# Patient Record
Sex: Female | Born: 1965 | Race: White | Hispanic: No | Marital: Married | Smoking: Never smoker
Health system: Southern US, Community
[De-identification: ages and names within clinical notes are randomized; demographics above are authoritative.]

## PROBLEM LIST (undated history)

## (undated) DIAGNOSIS — F341 Dysthymic disorder: Secondary | ICD-10-CM

## (undated) DIAGNOSIS — IMO0002 Reserved for concepts with insufficient information to code with codable children: Secondary | ICD-10-CM

## (undated) DIAGNOSIS — R51 Headache: Secondary | ICD-10-CM

## (undated) DIAGNOSIS — F32A Depression, unspecified: Secondary | ICD-10-CM

## (undated) DIAGNOSIS — M545 Low back pain, unspecified: Secondary | ICD-10-CM

## (undated) DIAGNOSIS — Z973 Presence of spectacles and contact lenses: Secondary | ICD-10-CM

## (undated) DIAGNOSIS — E785 Hyperlipidemia, unspecified: Secondary | ICD-10-CM

## (undated) DIAGNOSIS — E559 Vitamin D deficiency, unspecified: Secondary | ICD-10-CM

## (undated) DIAGNOSIS — M1990 Unspecified osteoarthritis, unspecified site: Secondary | ICD-10-CM

## (undated) DIAGNOSIS — G8929 Other chronic pain: Secondary | ICD-10-CM

## (undated) DIAGNOSIS — R7301 Impaired fasting glucose: Secondary | ICD-10-CM

## (undated) DIAGNOSIS — F329 Major depressive disorder, single episode, unspecified: Secondary | ICD-10-CM

## (undated) DIAGNOSIS — R519 Headache, unspecified: Secondary | ICD-10-CM

## (undated) DIAGNOSIS — F419 Anxiety disorder, unspecified: Secondary | ICD-10-CM

## (undated) DIAGNOSIS — S139XXA Sprain of joints and ligaments of unspecified parts of neck, initial encounter: Secondary | ICD-10-CM

## (undated) DIAGNOSIS — S161XXA Strain of muscle, fascia and tendon at neck level, initial encounter: Secondary | ICD-10-CM

## (undated) HISTORY — PX: SKIN CANCER EXCISION: SHX779

## (undated) HISTORY — DX: Dysthymic disorder: F34.1

## (undated) HISTORY — DX: Vitamin D deficiency, unspecified: E55.9

## (undated) HISTORY — DX: Sprain of joints and ligaments of unspecified parts of neck, initial encounter: S13.9XXA

## (undated) HISTORY — DX: Low back pain: M54.5

## (undated) HISTORY — DX: Headache, unspecified: R51.9

## (undated) HISTORY — DX: Impaired fasting glucose: R73.01

## (undated) HISTORY — DX: Other chronic pain: G89.29

## (undated) HISTORY — DX: Depression, unspecified: F32.A

## (undated) HISTORY — DX: Headache: R51

## (undated) HISTORY — DX: Reserved for concepts with insufficient information to code with codable children: IMO0002

## (undated) HISTORY — DX: Anxiety disorder, unspecified: F41.9

## (undated) HISTORY — DX: Major depressive disorder, single episode, unspecified: F32.9

## (undated) HISTORY — PX: TONSILLECTOMY: SUR1361

## (undated) HISTORY — DX: Hyperlipidemia, unspecified: E78.5

## (undated) HISTORY — DX: Low back pain, unspecified: M54.50

## (undated) HISTORY — PX: HERNIA REPAIR: SHX51

## (undated) HISTORY — DX: Strain of muscle, fascia and tendon at neck level, initial encounter: S16.1XXA

---

## 2004-05-06 ENCOUNTER — Ambulatory Visit (HOSPITAL_COMMUNITY): Admission: RE | Admit: 2004-05-06 | Discharge: 2004-05-06 | Payer: Self-pay | Admitting: Obstetrics and Gynecology

## 2004-11-27 ENCOUNTER — Inpatient Hospital Stay (HOSPITAL_COMMUNITY): Admission: AD | Admit: 2004-11-27 | Discharge: 2004-11-27 | Payer: Self-pay | Admitting: *Deleted

## 2005-04-28 ENCOUNTER — Ambulatory Visit: Payer: Self-pay | Admitting: Obstetrics and Gynecology

## 2006-05-23 ENCOUNTER — Ambulatory Visit: Payer: Self-pay | Admitting: Obstetrics and Gynecology

## 2007-06-14 ENCOUNTER — Ambulatory Visit: Payer: Self-pay | Admitting: Obstetrics and Gynecology

## 2007-06-20 ENCOUNTER — Ambulatory Visit: Payer: Self-pay | Admitting: Obstetrics and Gynecology

## 2008-07-03 ENCOUNTER — Ambulatory Visit: Payer: Self-pay | Admitting: Obstetrics and Gynecology

## 2009-07-09 ENCOUNTER — Ambulatory Visit: Payer: Self-pay | Admitting: Obstetrics and Gynecology

## 2010-07-13 ENCOUNTER — Ambulatory Visit: Payer: Self-pay | Admitting: Obstetrics and Gynecology

## 2010-07-27 ENCOUNTER — Ambulatory Visit: Payer: Self-pay | Admitting: Obstetrics and Gynecology

## 2011-08-11 ENCOUNTER — Ambulatory Visit: Payer: Self-pay | Admitting: Obstetrics and Gynecology

## 2012-08-16 ENCOUNTER — Ambulatory Visit: Payer: Self-pay | Admitting: Obstetrics and Gynecology

## 2012-10-29 ENCOUNTER — Ambulatory Visit: Payer: Self-pay

## 2015-03-31 ENCOUNTER — Other Ambulatory Visit: Payer: Self-pay | Admitting: Unknown Physician Specialty

## 2015-03-31 MED ORDER — SERTRALINE HCL 50 MG PO TABS: 50.0000 mg | ORAL_TABLET | Freq: Every day | ORAL | Status: DC

## 2015-07-01 ENCOUNTER — Other Ambulatory Visit: Payer: Self-pay | Admitting: Unknown Physician Specialty

## 2015-07-10 ENCOUNTER — Telehealth: Payer: Self-pay | Admitting: Unknown Physician Specialty

## 2015-07-10 ENCOUNTER — Encounter: Payer: Self-pay | Admitting: Unknown Physician Specialty

## 2015-07-10 ENCOUNTER — Ambulatory Visit (INDEPENDENT_AMBULATORY_CARE_PROVIDER_SITE_OTHER): Payer: Managed Care, Other (non HMO) | Admitting: Unknown Physician Specialty

## 2015-07-10 VITALS — BP 107/73 | HR 71 | Temp 98.5°F | Ht 69.6 in | Wt 167.0 lb

## 2015-07-10 DIAGNOSIS — G4700 Insomnia, unspecified: Secondary | ICD-10-CM

## 2015-07-10 DIAGNOSIS — S161XXA Strain of muscle, fascia and tendon at neck level, initial encounter: Secondary | ICD-10-CM | POA: Diagnosis not present

## 2015-07-10 DIAGNOSIS — S46011A Strain of muscle(s) and tendon(s) of the rotator cuff of right shoulder, initial encounter: Secondary | ICD-10-CM

## 2015-07-10 MED ORDER — CYCLOBENZAPRINE HCL 10 MG PO TABS: 10.0000 mg | ORAL_TABLET | Freq: Three times a day (TID) | ORAL | Status: DC | PRN

## 2015-07-10 MED ORDER — MELOXICAM 15 MG PO TABS: 15.0000 mg | ORAL_TABLET | Freq: Every day | ORAL | Status: DC

## 2015-07-10 MED ORDER — CLONAZEPAM 0.5 MG PO TABS: 0.5000 mg | ORAL_TABLET | Freq: Every day | ORAL | Status: DC

## 2015-07-10 NOTE — Assessment & Plan Note (Signed)
Refill Clonazepam but cautioned not to take it with Flexeril.  Begin weaning to occasional use.

## 2015-07-10 NOTE — Telephone Encounter (Signed)
Pt added to CW's schedule stated she talked with someone on Monday and was supposed to be added to the schedule today for 9:15, pt was not on the the schedule.

## 2015-07-10 NOTE — Progress Notes (Signed)
BP 107/73 mmHg  Pulse 71  Temp(Src) 98.5 F (36.9 C)  Ht 5' 9.6" (1.768 m)  Wt 167 lb (75.751 kg)  BMI 24.23 kg/m2  SpO2 97%   Subjective:    Patient ID: Virginia Austin, female    DOB: 01/31/1966, 49 y.o.   MRN: [id number]  HPI: Virginia Austin is a 49 y.o. female  Chief Complaint  Patient presents with  . Medication Refill  . Shoulder Pain    right, includes some neck pain   Neck/Shoulder Pain The pt presents to the office with c/o right neck and shoulder pain onset 2 months ago with worsening symptoms this morning.  The quality of pain is a shooting pain worse when raising her arm.  The pain is constant, he denies trauma she initially thought she slept wrong.  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  She occasionally applies heat and takes otc Ibuprofen without relief of symptoms.  Pertinent negatives denies pain, shortness of breath, palpitations, numbness/tingling or edema  Insomnia Pt taking Clonazepam QHS.  She would like a refill.  She understands not to take Flexeril plus Clonazepam.  She is taking Sertraline daily.    Relevant past medical, surgical, family and social history reviewed and updated as indicated. Interim medical history since our last visit reviewed. Allergies and medications reviewed and updated.  Review of Systems  Constitutional: Negative.   HENT: Negative.   Eyes: Negative.   Respiratory: Negative.   Cardiovascular: Negative.   Gastrointestinal: Negative.   Musculoskeletal: Positive for neck pain and neck stiffness.       Shoulder pain  Neurological: Negative.   Psychiatric/Behavioral: Negative.     Per HPI unless specifically indicated above     Objective:    BP 107/73 mmHg  Pulse 71  Temp(Src) 98.5 F (36.9 C)  Ht 5' 9.6" (1.768 m)  Wt 167 lb (75.751 kg)  BMI 24.23 kg/m2  SpO2 97%  Wt Readings from Last 3 Encounters:  07/08/14 163 lb (73.936 kg)  07/10/15 167 lb (75.751 kg)    Physical Exam   Constitutional: She is oriented to person, place, and time. She appears well-developed and well-nourished. No distress.  HENT:  Head: Normocephalic and atraumatic.  Right Ear: External ear normal.  Left Ear: External ear normal.  Nose: Nose normal.  Neck:  Limited ROM of neck secondary to pain  Cardiovascular: Normal rate, regular rhythm, normal heart sounds and intact distal pulses.   Pulmonary/Chest: Effort normal and breath sounds normal. No respiratory distress. She has no wheezes. She exhibits no tenderness.  Musculoskeletal: She exhibits no edema.  Limited ROM of neck and right shoulder secondary to tenderness; tenderness present with Empty Can and Neer's exam  Neurological: She is alert and oriented to person, place, and time.  Skin: Skin is warm and dry. No rash noted. She is not diaphoretic. No erythema. No pallor.  Psychiatric: She has a normal mood and affect. Her behavior is normal. Judgment and thought content normal.    No results found for this or any previous visit.    Assessment & Plan:   Problem List Items Addressed This Visit      Unprioritized   Insomnia    Refill Clonazepam but cautioned not to take it with Flexeril.  Begin weaning to occasional use.         Other Visit Diagnoses    Cervical strain, initial encounter    -  Primary  Referral made to Physical Therapy Prescribed Meloxicam and Flexiral for muscle spasms and pain    Relevant Orders    Ambulatory referral to Physical Therapy    Rotator cuff (capsule) sprain and strain, right, initial encounter        Referral made to Physical Therapy Prescribed Meloxicam and Flexiral for muscle spasms and pain    Relevant Orders    Ambulatory referral to Physical Therapy        Follow up plan: Return if symptoms worsen or fail to improve.

## 2015-11-16 ENCOUNTER — Other Ambulatory Visit: Payer: Self-pay | Admitting: Obstetrics and Gynecology

## 2015-11-18 ENCOUNTER — Encounter (HOSPITAL_COMMUNITY)
Admission: RE | Admit: 2015-11-18 | Discharge: 2015-11-18 | Disposition: A | Discharge status: Home / Self Care | Payer: Managed Care, Other (non HMO) | Source: Ambulatory Visit | Attending: Obstetrics and Gynecology | Admitting: Obstetrics and Gynecology

## 2015-11-18 ENCOUNTER — Encounter (HOSPITAL_COMMUNITY): Payer: Self-pay

## 2015-11-18 DIAGNOSIS — N842 Polyp of vagina: Secondary | ICD-10-CM | POA: Diagnosis not present

## 2015-11-18 DIAGNOSIS — E785 Hyperlipidemia, unspecified: Secondary | ICD-10-CM | POA: Diagnosis not present

## 2015-11-18 DIAGNOSIS — N950 Postmenopausal bleeding: Secondary | ICD-10-CM | POA: Diagnosis not present

## 2015-11-18 DIAGNOSIS — N840 Polyp of corpus uteri: Secondary | ICD-10-CM | POA: Diagnosis not present

## 2015-11-18 LAB — CBC
HCT: 35.9 % — ABNORMAL LOW (ref 36.0–46.0)
Hemoglobin: 12.1 g/dL (ref 12.0–15.0)
MCH: 30.8 pg (ref 26.0–34.0)
MCHC: 33.7 g/dL (ref 30.0–36.0)
MCV: 91.3 fL (ref 78.0–100.0)
PLATELETS: 330 10*3/uL (ref 150–400)
RBC: 3.93 MIL/uL (ref 3.87–5.11)
RDW: 13.1 % (ref 11.5–15.5)
WBC: 5.8 10*3/uL (ref 4.0–10.5)

## 2015-11-18 NOTE — Patient Instructions (Addendum)
Your procedure is scheduled on: Thursday, March 9  Enter through the Main Entrance of [facility] at: 7:30am  Pick up the phone at the desk and dial [phone].  Call this number if you have problems the morning of surgery: [phone].  Remember: Do NOT eat food: after midnight Wednesday, 3/8 Do NOT drink clear liquids  after midnight Wednesday, 3/8 Take these medicines the morning of surgery with a SIP OF WATER:  None  Do NOT wear jewelry (body piercing), metal hair clips/bobby pins, make-up, or nail polish. Do NOT wear lotions, powders, or perfumes.  You may wear deoderant. Do NOT shave for 48 hours prior to surgery. Do NOT bring valuables to the hospital. Contacts, dentures, or bridgework may not be worn into surgery.   Have a responsible adult drive you home and stay with you for 24 hours after your procedure.

## 2015-11-19 ENCOUNTER — Encounter (HOSPITAL_COMMUNITY)
Admission: RE | Disposition: A | Discharge status: Home / Self Care | Payer: Self-pay | Source: Ambulatory Visit | Attending: Obstetrics and Gynecology

## 2015-11-19 ENCOUNTER — Ambulatory Visit (HOSPITAL_COMMUNITY): Payer: Managed Care, Other (non HMO) | Admitting: Anesthesiology

## 2015-11-19 ENCOUNTER — Ambulatory Visit (HOSPITAL_COMMUNITY)
Admission: RE | Admit: 2015-11-19 | Discharge: 2015-11-19 | Disposition: A | Discharge status: Home / Self Care | Payer: Managed Care, Other (non HMO) | Source: Ambulatory Visit | Attending: Obstetrics and Gynecology | Admitting: Obstetrics and Gynecology

## 2015-11-19 ENCOUNTER — Encounter (HOSPITAL_COMMUNITY): Payer: Self-pay | Admitting: *Deleted

## 2015-11-19 DIAGNOSIS — E785 Hyperlipidemia, unspecified: Secondary | ICD-10-CM | POA: Insufficient documentation

## 2015-11-19 DIAGNOSIS — N842 Polyp of vagina: Secondary | ICD-10-CM | POA: Insufficient documentation

## 2015-11-19 DIAGNOSIS — N840 Polyp of corpus uteri: Secondary | ICD-10-CM | POA: Insufficient documentation

## 2015-11-19 DIAGNOSIS — N950 Postmenopausal bleeding: Secondary | ICD-10-CM | POA: Insufficient documentation

## 2015-11-19 HISTORY — PX: DILATATION & CURETTAGE/HYSTEROSCOPY WITH MYOSURE: SHX6511

## 2015-11-19 SURGERY — DILATATION & CURETTAGE/HYSTEROSCOPY WITH MYOSURE
Anesthesia: General

## 2015-11-19 MED ORDER — FENTANYL CITRATE (PF) 100 MCG/2ML IJ SOLN
25.0000 ug | INTRAMUSCULAR | Status: DC | PRN
  Administered 2015-11-19: 50 ug via INTRAVENOUS
  Administered 2015-11-19 (×2): 25 ug via INTRAVENOUS
  Administered 2015-11-19: 50 ug via INTRAVENOUS

## 2015-11-19 MED ORDER — MIDAZOLAM HCL 2 MG/2ML IJ SOLN
INTRAMUSCULAR | Status: AC
  Filled 2015-11-19: qty 2

## 2015-11-19 MED ORDER — SCOPOLAMINE 1 MG/3DAYS TD PT72
1.0000 | MEDICATED_PATCH | Freq: Once | TRANSDERMAL | Status: DC
  Administered 2015-11-19: 1.5 mg via TRANSDERMAL

## 2015-11-19 MED ORDER — ONDANSETRON HCL 4 MG/2ML IJ SOLN
INTRAMUSCULAR | Status: DC | PRN
  Administered 2015-11-19: 4 mg via INTRAVENOUS

## 2015-11-19 MED ORDER — DEXAMETHASONE SODIUM PHOSPHATE 4 MG/ML IJ SOLN
INTRAMUSCULAR | Status: AC
  Filled 2015-11-19: qty 1

## 2015-11-19 MED ORDER — FENTANYL CITRATE (PF) 100 MCG/2ML IJ SOLN
INTRAMUSCULAR | Status: AC
  Filled 2015-11-19: qty 2

## 2015-11-19 MED ORDER — DEXAMETHASONE SODIUM PHOSPHATE 10 MG/ML IJ SOLN
INTRAMUSCULAR | Status: DC | PRN
  Administered 2015-11-19: 4 mg via INTRAVENOUS

## 2015-11-19 MED ORDER — SODIUM CHLORIDE 0.9 % IJ SOLN
INTRAMUSCULAR | Status: AC
  Filled 2015-11-19: qty 50

## 2015-11-19 MED ORDER — VASOPRESSIN 20 UNIT/ML IV SOLN
INTRAVENOUS | Status: DC | PRN
Start: 2015-11-19 — End: 2015-11-19
  Administered 2015-11-19: 20 [IU] via INTRAMUSCULAR

## 2015-11-19 MED ORDER — BUPIVACAINE HCL (PF) 0.25 % IJ SOLN
INTRAMUSCULAR | Status: AC
  Filled 2015-11-19: qty 30

## 2015-11-19 MED ORDER — KETOROLAC TROMETHAMINE 30 MG/ML IJ SOLN
INTRAMUSCULAR | Status: AC
  Filled 2015-11-19: qty 1

## 2015-11-19 MED ORDER — FENTANYL CITRATE (PF) 100 MCG/2ML IJ SOLN
INTRAMUSCULAR | Status: AC
  Administered 2015-11-19: 50 ug via INTRAVENOUS
  Filled 2015-11-19: qty 2

## 2015-11-19 MED ORDER — SODIUM CHLORIDE 0.9 % IR SOLN
Status: DC | PRN
  Administered 2015-11-19: 3000 mL

## 2015-11-19 MED ORDER — SCOPOLAMINE 1 MG/3DAYS TD PT72
MEDICATED_PATCH | TRANSDERMAL | Status: AC
  Administered 2015-11-19: 1.5 mg via TRANSDERMAL
  Filled 2015-11-19: qty 1

## 2015-11-19 MED ORDER — SODIUM CHLORIDE 0.9 % IJ SOLN
INTRAMUSCULAR | Status: DC | PRN
  Administered 2015-11-19: 50 mL

## 2015-11-19 MED ORDER — CEFAZOLIN SODIUM-DEXTROSE 2-3 GM-% IV SOLR
INTRAVENOUS | Status: AC
  Filled 2015-11-19: qty 50

## 2015-11-19 MED ORDER — ONDANSETRON HCL 4 MG/2ML IJ SOLN
INTRAMUSCULAR | Status: AC
  Filled 2015-11-19: qty 2

## 2015-11-19 MED ORDER — VASOPRESSIN 20 UNIT/ML IV SOLN
INTRAVENOUS | Status: AC
  Filled 2015-11-19: qty 1

## 2015-11-19 MED ORDER — BUPIVACAINE HCL (PF) 0.25 % IJ SOLN
INTRAMUSCULAR | Status: DC | PRN
  Administered 2015-11-19: 11 mL

## 2015-11-19 MED ORDER — KETOROLAC TROMETHAMINE 30 MG/ML IJ SOLN
INTRAMUSCULAR | Status: DC | PRN
  Administered 2015-11-19: 30 mg via INTRAVENOUS

## 2015-11-19 MED ORDER — CEFAZOLIN SODIUM-DEXTROSE 2-3 GM-% IV SOLR
2.0000 g | INTRAVENOUS | Status: AC
  Administered 2015-11-19: 2 g via INTRAVENOUS

## 2015-11-19 MED ORDER — PROPOFOL 10 MG/ML IV BOLUS
INTRAVENOUS | Status: AC
  Filled 2015-11-19: qty 20

## 2015-11-19 MED ORDER — LACTATED RINGERS IV SOLN
INTRAVENOUS | Status: DC
  Administered 2015-11-19: 08:00:00 via INTRAVENOUS

## 2015-11-19 MED ORDER — FENTANYL CITRATE (PF) 100 MCG/2ML IJ SOLN
INTRAMUSCULAR | Status: DC | PRN
  Administered 2015-11-19 (×2): 50 ug via INTRAVENOUS

## 2015-11-19 MED ORDER — MIDAZOLAM HCL 2 MG/2ML IJ SOLN
INTRAMUSCULAR | Status: DC | PRN
  Administered 2015-11-19: 1 mg via INTRAVENOUS

## 2015-11-19 MED ORDER — LIDOCAINE HCL (CARDIAC) 20 MG/ML IV SOLN
INTRAVENOUS | Status: AC
  Filled 2015-11-19: qty 5

## 2015-11-19 MED ORDER — PROPOFOL 10 MG/ML IV BOLUS
INTRAVENOUS | Status: DC | PRN
  Administered 2015-11-19: 200 mg via INTRAVENOUS

## 2015-11-19 MED ORDER — ONDANSETRON HCL 4 MG/2ML IJ SOLN: 4.0000 mg | Freq: Once | INTRAMUSCULAR | Status: DC | PRN

## 2015-11-19 MED ORDER — LIDOCAINE HCL (CARDIAC) 20 MG/ML IV SOLN
INTRAVENOUS | Status: DC | PRN
  Administered 2015-11-19: 70 mg via INTRAVENOUS
  Administered 2015-11-19: 30 mg via INTRAVENOUS

## 2015-11-19 MED ORDER — HYDROCODONE-IBUPROFEN 7.5-200 MG PO TABS: 1.0000 | ORAL_TABLET | Freq: Three times a day (TID) | ORAL | Status: DC | PRN

## 2015-11-19 SURGICAL SUPPLY — 21 items
CANISTER SUCT 3000ML (MISCELLANEOUS) ×3 IMPLANT
CATH ROBINSON RED A/P 16FR (CATHETERS) ×3 IMPLANT
CLOTH BEACON ORANGE TIMEOUT ST (SAFETY) ×3 IMPLANT
CONTAINER PREFILL 10% NBF 60ML (FORM) ×6 IMPLANT
DEVICE MYOSURE LITE (MISCELLANEOUS) ×2 IMPLANT
DEVICE MYOSURE REACH (MISCELLANEOUS) IMPLANT
FILTER ARTHROSCOPY CONVERTOR (FILTER) ×3 IMPLANT
GLOVE BIO SURGEON STRL SZ7.5 (GLOVE) ×3 IMPLANT
GLOVE BIOGEL PI IND STRL 7.0 (GLOVE) ×1 IMPLANT
GLOVE BIOGEL PI INDICATOR 7.0 (GLOVE) ×2
GOWN STRL REUS W/TWL LRG LVL3 (GOWN DISPOSABLE) ×9 IMPLANT
PACK VAGINAL MINOR WOMEN LF (CUSTOM PROCEDURE TRAY) ×3 IMPLANT
PAD OB MATERNITY 4.3X12.25 (PERSONAL CARE ITEMS) ×3 IMPLANT
SEAL ROD LENS SCOPE MYOSURE (ABLATOR) ×3 IMPLANT
SUT VIC AB 2-0 SH 27 (SUTURE) ×3
SUT VIC AB 2-0 SH 27XBRD (SUTURE) IMPLANT
SYR TB 1ML 25GX5/8 (SYRINGE) ×3 IMPLANT
TOWEL OR 17X24 6PK STRL BLUE (TOWEL DISPOSABLE) ×6 IMPLANT
TUBING AQUILEX INFLOW (TUBING) ×3 IMPLANT
TUBING AQUILEX OUTFLOW (TUBING) ×3 IMPLANT
WATER STERILE IRR 1000ML POUR (IV SOLUTION) ×3 IMPLANT

## 2015-11-19 NOTE — Transfer of Care (Signed)
Immediate Anesthesia Transfer of Care Note  Patient: Virginia Austin  Procedure(s) Performed: Procedure(s): DILATATION & CURETTAGE/HYSTEROSCOPY WITH MYOSURE (N/A)  Patient Location: PACU  Anesthesia Type:General  Level of Consciousness: awake, alert , oriented and patient cooperative  Airway & Oxygen Therapy: Patient Spontanous Breathing and Patient connected to nasal cannula oxygen  Post-op Assessment: Report given to RN and Post -op Vital signs reviewed and stable  Post vital signs: Reviewed and stable  Last Vitals:  Filed Vitals:   11/19/15 0723  BP: 109/69  Pulse: 73  Temp: 36.6 C  Resp: 18    Complications: No apparent anesthesia complications

## 2015-11-19 NOTE — Op Note (Signed)
NAME:  KINDAL, OHAGAN NO.:  [id number]  MEDICAL RECORD NO.:  [id number]  LOCATION:  [facility]                          FACILITY:  [facility]  PHYSICIAN:  Lovenia Kim, M.D.DATE OF BIRTH:  1966/07/31  DATE OF PROCEDURE: DATE OF DISCHARGE:                              OPERATIVE REPORT   PREOPERATIVE DIAGNOSIS:  Postmenopausal bleeding.  POSTOPERATIVE DIAGNOSIS:  Postmenopausal bleeding plus endometrial polyp, vaginal wall polyp, endometrial synechiae.  PROCEDURE:  Diagnostic hysteroscopy, D and C, resection of endometrial synechiae, resection of endometrial polyp, removal of vaginal wall polyp.  SURGEON:  Lovenia Kim, MD  ASSISTANT:  None.  ANESTHESIA:  General and local.  ESTIMATED BLOOD LOSS:  Less than 50 mL.  FLUID DEFICIT:  100 mL.  COMPLICATIONS:  None.  DRAINS:  None.  COUNTS:  Correct.  DISPOSITION:  Patient to recovery room in good condition.  BRIEF OPERATIVE NOTE:  After being apprised of risks of anesthesia, infection, bleeding, and surrounding organs possible need for repair, delayed versus immediate complications to include bowel and bladder injury, possible need for repair, the patient was brought to the operating room where she was administered general anesthetic without complications, prepped and draped in usual sterile fashion. Catheterized until the bladder was empty.  Exam under anesthesia revealed a bulky anteflexed uterus and no adnexal masses.  Dilute Pitressin solution placed at 3 and 9 o'clock at the cervicovaginal junction without difficulty, 20 mL of total Marcaine solution placed in a paracervical block, 0.25%, 20 mL total without difficulty.  Cervix easily dilated up to 21 Pratt dilator.  Hysteroscope placed.  MyoSure was used to resect anterior and posterior wall endometrial polyp as well as endometrial synechiae in the right upper fundal area.  At this time, cavity appears normal.  Good hemostasis noted.  D and C  performed in the usual 4-quadrant method.  Specimen collected and sent together.  At this time, vaginal wall polyp resected using sharp dissection, 2-0 Vicryl suture placed in interrupted fashion.  Good hemostasis noted.  Fluid deficit was noted. The patient tolerated the procedure well, was awakened and transferred to recovery in good condition.     Lovenia Kim, M.D.     RJT/MEDQ  D:  11/19/2015  T:  11/19/2015  Job:  [id number]

## 2015-11-19 NOTE — Anesthesia Preprocedure Evaluation (Signed)
Anesthesia Evaluation  Patient identified by MRN, date of birth, ID band Patient awake    Reviewed: Allergy & Precautions, NPO status , Patient's Chart, lab work & pertinent test results  Airway Mallampati: II  TM Distance: >3 FB Neck ROM: Full    Dental no notable dental hx. (+) Dental Advisory Given   Pulmonary neg pulmonary ROS,    Pulmonary exam normal breath sounds clear to auscultation       Cardiovascular negative cardio ROS Normal cardiovascular exam Rhythm:Regular Rate:Normal     Neuro/Psych  Headaches, PSYCHIATRIC DISORDERS Anxiety Depression    GI/Hepatic negative GI ROS, Neg liver ROS,   Endo/Other  negative endocrine ROS  Renal/GU negative Renal ROS  negative genitourinary   Musculoskeletal negative musculoskeletal ROS (+)   Abdominal   Peds negative pediatric ROS (+)  Hematology negative hematology ROS (+)   Anesthesia Other Findings   Reproductive/Obstetrics negative OB ROS                             Anesthesia Physical Anesthesia Plan  ASA: II  Anesthesia Plan: General   Post-op Pain Management:    Induction: Intravenous  Airway Management Planned: LMA  Additional Equipment:   Intra-op Plan:   Post-operative Plan: Extubation in OR  Informed Consent: I have reviewed the patients History and Physical, chart, labs and discussed the procedure including the risks, benefits and alternatives for the proposed anesthesia with the patient or authorized representative who has indicated his/her understanding and acceptance.   Dental advisory given  Plan Discussed with: CRNA  Anesthesia Plan Comments:         Anesthesia Quick Evaluation

## 2015-11-19 NOTE — H&P (Signed)
Virginia Austin is an 50 y.o. female. PMP bleeding and surgical evaluation.  Pertinent Gynecological History: Menses: post-menopausal Bleeding: post menopausal bleeding Contraception: none DES exposure: denies Blood transfusions: none Sexually transmitted diseases: no past history Previous GYN Procedures: na  Last mammogram: normal Date: 2016 Last pap: normal Date: 2017 OB History: G2, P2   Menstrual History: Menarche age: 15  No LMP recorded. Patient is postmenopausal.    Past Medical History  Diagnosis Date  . Hyperlipidemia     diet controlled, no meds  . Anxiety   . Depression   . Vitamin D deficiency     Hx - resolved per patient  . Lumbago   . Impaired fasting glucose   . Dysthymic disorder   . Neck sprain and strain     Hx  . Chronic headaches     otc med prn    Past Surgical History  Procedure Laterality Date  . Hernia repair    . Tonsillectomy      Family History  Problem Relation Age of Onset  . Diabetes Father   . Hypertension Father   . Hypertension Sister   . Diabetes Paternal Grandmother   . Hypertension Paternal Grandmother   . Thyroid disease Paternal Grandmother     Social History:  reports that she has never smoked. She has never used smokeless tobacco. She reports that she drinks about 0.6 oz of alcohol per week. She reports that she does not use illicit drugs.  Allergies: No Known Allergies  No prescriptions prior to admission    Review of Systems  Constitutional: Negative.   All other systems reviewed and are negative.   There were no vitals taken for this visit. Physical Exam  Nursing note and vitals reviewed. Constitutional: She is oriented to person, place, and time. She appears well-developed and well-nourished.  HENT:  Head: Normocephalic and atraumatic.  Neck: Normal range of motion. Neck supple.  Cardiovascular: Normal rate and regular rhythm.   Respiratory: Effort normal and breath sounds normal.  GI: Soft. Bowel  sounds are normal.  Genitourinary: Vagina normal and uterus normal.  Musculoskeletal: Normal range of motion.  Neurological: She is alert and oriented to person, place, and time. She has normal reflexes.  Skin: Skin is warm and dry.  Psychiatric: She has a normal mood and affect.    Results for orders placed or performed during the hospital encounter of 11/18/15 (from the past 24 hour(s))  CBC     Status: Abnormal   Collection Time: 11/18/15  1:30 PM  Result Value Ref Range   WBC 5.8 4.0 - 10.5 K/uL   RBC 3.93 3.87 - 5.11 MIL/uL   Hemoglobin 12.1 12.0 - 15.0 g/dL   HCT 35.9 (L) 36.0 - 46.0 %   MCV 91.3 78.0 - 100.0 fL   MCH 30.8 26.0 - 34.0 pg   MCHC 33.7 30.0 - 36.0 g/dL   RDW 13.1 11.5 - 15.5 %   Platelets 330 150 - 400 K/uL    No results found.  Assessment/Plan: PMB with possible structural lesion Diag HS, D&C, Myosure Surgical risks discussed. Consent done.  Jenascia Bumpass J 11/19/2015, 6:38 AM

## 2015-11-19 NOTE — Progress Notes (Signed)
Patient ID: Virginia Austin, female   DOB: 07/24/1966, 50 y.o.   MRN: [id number] Patient seen and examined. Consent witnessed and signed. No changes noted. Update completed.

## 2015-11-19 NOTE — Op Note (Signed)
11/19/2015  9:45 AM  PATIENT:  Virginia Austin  49 y.o. female  PRE-OPERATIVE DIAGNOSIS:  Postmenopausal Bleeding  POST-OPERATIVE DIAGNOSIS:  Postmenopausal Bleeding Vaginal wall polyp  PROCEDURE:  Procedure(s): DILATATION & CURETTAGE HYSTEROSCOPY WITH MYOSURE RESECTION OF ENDOMETRIAL POLYP AND SYNECHIAE REMOVAL OF VAGINAL WALL POLYP  SURGEON:  Surgeon(s): Brien Few, MD  ASSISTANTS: none   ANESTHESIA:   local and general  ESTIMATED BLOOD LOSS: minimal  DRAINS: none   LOCAL MEDICATIONS USED:  MARCAINE    and Amount: 20 ml  SPECIMEN:  Source of Specimen:  endometrial polyp, EMC, vaginal wall polyp  DISPOSITION OF SPECIMEN:  PATHOLOGY  COUNTS:  YES  DICTATION #[id number]  PLAN OF CARE: dc home  PATIENT DISPOSITION:  PACU - hemodynamically stable.

## 2015-11-19 NOTE — Anesthesia Procedure Notes (Signed)
Procedure Name: LMA Insertion Date/Time: 11/19/2015 9:06 AM Performed by: Tobin Chad Pre-anesthesia Checklist: Patient identified and Emergency Drugs available Patient Re-evaluated:Patient Re-evaluated prior to inductionOxygen Delivery Method: Circle system utilized and Simple face mask Preoxygenation: Pre-oxygenation with 100% oxygen Intubation Type: IV induction and Inhalational induction Ventilation: Mask ventilation without difficulty Grade View: Grade II Number of attempts: 1

## 2015-11-19 NOTE — Anesthesia Postprocedure Evaluation (Signed)
Anesthesia Post Note  Patient: Virginia Austin  Procedure(s) Performed: Procedure(s) (LRB): [place] (N/A)  Patient location during evaluation: PACU Anesthesia Type: General Level of consciousness: awake and alert Pain management: pain level controlled Vital Signs Assessment: post-procedure vital signs reviewed and stable Respiratory status: spontaneous breathing, nonlabored ventilation, respiratory function stable and patient connected to nasal cannula oxygen Cardiovascular status: blood pressure returned to baseline and stable Postop Assessment: no signs of nausea or vomiting Anesthetic complications: no    Last Vitals:  Filed Vitals:   11/19/15 1100 11/19/15 1115  BP: 109/72 107/69  Pulse: 78 81  Temp:  37.1 C  Resp: 13 13    Last Pain:  Filed Vitals:   11/19/15 1121  PainSc: 1                  Hiroko Tregre JENNETTE

## 2015-11-21 ENCOUNTER — Encounter (HOSPITAL_COMMUNITY): Payer: Self-pay | Admitting: Obstetrics and Gynecology

## 2016-02-09 ENCOUNTER — Encounter: Payer: Self-pay | Admitting: Unknown Physician Specialty

## 2016-02-09 ENCOUNTER — Ambulatory Visit (INDEPENDENT_AMBULATORY_CARE_PROVIDER_SITE_OTHER): Payer: Managed Care, Other (non HMO) | Admitting: Unknown Physician Specialty

## 2016-02-09 VITALS — BP 107/77 | HR 88 | Temp 98.2°F | Ht 69.0 in | Wt 165.8 lb

## 2016-02-09 DIAGNOSIS — J0100 Acute maxillary sinusitis, unspecified: Secondary | ICD-10-CM

## 2016-02-09 MED ORDER — AMOXICILLIN-POT CLAVULANATE 875-125 MG PO TABS: 1.0000 | ORAL_TABLET | Freq: Two times a day (BID) | ORAL | Status: DC

## 2016-02-09 MED ORDER — BENZONATATE 200 MG PO CAPS: 200.0000 mg | ORAL_CAPSULE | Freq: Two times a day (BID) | ORAL | Status: DC | PRN

## 2016-02-09 NOTE — Progress Notes (Signed)
BP 107/77 mmHg  Pulse 88  Temp(Src) 98.2 F (36.8 C)  Ht 5\' 9"  (1.753 m)  Wt 165 lb 12.8 oz (75.206 kg)  BMI 24.47 kg/m2  SpO2 97%   Subjective:    Patient ID: Virginia Austin, female    DOB: 11-29-1965, 50 y.o.   MRN: [id number]  HPI: Virginia Austin is a 50 y.o. female  Chief Complaint  Patient presents with  . URI    pt states she has congestion, cough, sore throat, sinus pressure, green phlegm, and puffy eyes. States symptoms started 3 weeks ago.   Cough This is a new problem. Episode onset: 3 weeks. The problem has been unchanged. The problem occurs constantly. The cough is productive of purulent sputum. Associated symptoms include nasal congestion, postnasal drip, rhinorrhea and a sore throat. Pertinent negatives include no fever, headaches, hemoptysis or shortness of breath. The symptoms are aggravated by lying down. Treatments tried: cough drops, Nyquil,      Relevant past medical, surgical, family and social history reviewed and updated as indicated. Interim medical history since our last visit reviewed. Allergies and medications reviewed and updated.  Review of Systems  Constitutional: Negative for fever.  HENT: Positive for postnasal drip, rhinorrhea and sore throat.   Respiratory: Positive for cough. Negative for hemoptysis and shortness of breath.   Neurological: Negative for headaches.    Per HPI unless specifically indicated above     Objective:    BP 107/77 mmHg  Pulse 88  Temp(Src) 98.2 F (36.8 C)  Ht 5\' 9"  (1.753 m)  Wt 165 lb 12.8 oz (75.206 kg)  BMI 24.47 kg/m2  SpO2 97%  Wt Readings from Last 3 Encounters:  02/09/16 165 lb 12.8 oz (75.206 kg)  11/18/15 170 lb (77.111 kg)  07/08/14 163 lb (73.936 kg)    Physical Exam  Constitutional: She is oriented to person, place, and time. She appears well-developed and well-nourished. No distress.  HENT:  Head: Normocephalic and atraumatic.  Right Ear: Tympanic membrane and ear canal normal.  Left Ear:  Tympanic membrane and ear canal normal.  Nose: No rhinorrhea. Right sinus exhibits maxillary sinus tenderness. Right sinus exhibits no frontal sinus tenderness. Left sinus exhibits maxillary sinus tenderness. Left sinus exhibits no frontal sinus tenderness.  Eyes: Conjunctivae and lids are normal. Right eye exhibits no discharge. Left eye exhibits no discharge. No scleral icterus.  Cardiovascular: Normal rate and regular rhythm.   Pulmonary/Chest: Effort normal and breath sounds normal. No respiratory distress.  Abdominal: Normal appearance. There is no splenomegaly or hepatomegaly.  Musculoskeletal: Normal range of motion.  Neurological: She is alert and oriented to person, place, and time.  Skin: Skin is intact. No rash noted. No pallor.  Psychiatric: She has a normal mood and affect. Her behavior is normal. Judgment and thought content normal.    Results for orders placed or performed during the hospital encounter of 11/18/15  CBC  Result Value Ref Range   WBC 5.8 4.0 - 10.5 K/uL   RBC 3.93 3.87 - 5.11 MIL/uL   Hemoglobin 12.1 12.0 - 15.0 g/dL   HCT 35.9 (L) 36.0 - 46.0 %   MCV 91.3 78.0 - 100.0 fL   MCH 30.8 26.0 - 34.0 pg   MCHC 33.7 30.0 - 36.0 g/dL   RDW 13.1 11.5 - 15.5 %   Platelets 330 150 - 400 K/uL      Assessment & Plan:   Problem List Items Addressed This Visit    None  Visit Diagnoses    Acute maxillary sinusitis, recurrence not specified    -  Primary    Relevant Medications    Levocetirizine Dihydrochloride (XYZAL ALLERGY 24HR PO)    amoxicillin-clavulanate (AUGMENTIN) 875-125 MG tablet    benzonatate (TESSALON) 200 MG capsule        Follow up plan: Return in about 3 months (around 05/11/2016).

## 2016-02-24 ENCOUNTER — Telehealth: Payer: Self-pay | Admitting: Unknown Physician Specialty

## 2016-02-24 MED ORDER — AZITHROMYCIN 250 MG PO TABS: ORAL_TABLET | ORAL | Status: DC

## 2016-02-24 NOTE — Telephone Encounter (Signed)
Routing to provider. Amoxicillin was added as an intolerance to allergy list.

## 2016-02-24 NOTE — Telephone Encounter (Signed)
Pt called stated she is not feeling any better, pt would it to be noted that Amoxicillian gives pt a really bad yeast infection and she would rather not take this medication again, pt would like to know if a Z Pac can be called in for her. Pharm is [facility]. Thanks.

## 2016-02-24 NOTE — Telephone Encounter (Signed)
Done

## 2016-02-25 NOTE — Telephone Encounter (Signed)
Called and left patient a voicemail letting her know that new medication was sent in and the amoxicillin was added to allergy/intolerance list in chart.

## 2016-02-29 ENCOUNTER — Other Ambulatory Visit: Payer: Self-pay | Admitting: Unknown Physician Specialty

## 2016-02-29 NOTE — Telephone Encounter (Signed)
Called and left patient a voicemail asking for her to please return my call.  

## 2016-02-29 NOTE — Telephone Encounter (Signed)
I don't see this on her medication list?

## 2016-03-01 NOTE — Telephone Encounter (Signed)
Called and spoke to patient. She stated she is taking this medication and I added it to her medication list.

## 2016-03-01 NOTE — Telephone Encounter (Signed)
I will refill it but I need to see her for this

## 2016-03-01 NOTE — Telephone Encounter (Signed)
Called and scheduled patient a f/u for 03/25/16.

## 2016-03-25 ENCOUNTER — Ambulatory Visit (INDEPENDENT_AMBULATORY_CARE_PROVIDER_SITE_OTHER): Payer: Managed Care, Other (non HMO) | Admitting: Unknown Physician Specialty

## 2016-03-25 ENCOUNTER — Encounter: Payer: Self-pay | Admitting: Unknown Physician Specialty

## 2016-03-25 VITALS — BP 99/69 | HR 71 | Temp 97.7°F | Ht 69.8 in | Wt 165.2 lb

## 2016-03-25 DIAGNOSIS — G4700 Insomnia, unspecified: Secondary | ICD-10-CM

## 2016-03-25 MED ORDER — CLONAZEPAM 0.5 MG PO TABS: 0.5000 mg | ORAL_TABLET | Freq: Every day | ORAL | Status: DC

## 2016-03-25 NOTE — Patient Instructions (Addendum)
2 tablespoons of tart cherry juice concentrate L theanine supplement Magnesium Citrate (I take Natural Calm) Mindfulness Meditation CBT for sleep - shuti  Insomnia Insomnia is a sleep disorder that makes it difficult to fall asleep or to stay asleep. Insomnia can cause tiredness (fatigue), low energy, difficulty concentrating, mood swings, and poor performance at work or school.  There are three different ways to classify insomnia:  Difficulty falling asleep.  Difficulty staying asleep.  Waking up too early in the morning. Any type of insomnia can be long-term (chronic) or short-term (acute). Both are common. Short-term insomnia usually lasts for three months or less. Chronic insomnia occurs at least three times a week for longer than three months. CAUSES  Insomnia may be caused by another condition, situation, or substance, such as:  Anxiety.  Certain medicines.  Gastroesophageal reflux disease (GERD) or other gastrointestinal conditions.  Asthma or other breathing conditions.  Restless legs syndrome, sleep apnea, or other sleep disorders.  Chronic pain.  Menopause. This may include hot flashes.  Stroke.  Abuse of alcohol, tobacco, or illegal drugs.  Depression.  Caffeine.   Neurological disorders, such as Alzheimer disease.  An overactive thyroid (hyperthyroidism). The cause of insomnia may not be known. RISK FACTORS Risk factors for insomnia include:  Gender. Women are more commonly affected than men.  Age. Insomnia is more common as you get older.  Stress. This may involve your professional or personal life.  Income. Insomnia is more common in people with lower income.  Lack of exercise.   Irregular work schedule or night shifts.  Traveling between different time zones. SIGNS AND SYMPTOMS If you have insomnia, trouble falling asleep or trouble staying asleep is the main symptom. This may lead to other symptoms, such as:  Feeling  fatigued.  Feeling nervous about going to sleep.  Not feeling rested in the morning.  Having trouble concentrating.  Feeling irritable, anxious, or depressed. TREATMENT  Treatment for insomnia depends on the cause. If your insomnia is caused by an underlying condition, treatment will focus on addressing the condition. Treatment may also include:   Medicines to help you sleep.  Counseling or therapy.  Lifestyle adjustments. HOME CARE INSTRUCTIONS   Take medicines only as directed by your health care provider.  Keep regular sleeping and waking hours. Avoid naps.  Keep a sleep diary to help you and your health care provider figure out what could be causing your insomnia. Include:   When you sleep.  When you wake up during the night.  How well you sleep.   How rested you feel the next day.  Any side effects of medicines you are taking.  What you eat and drink.   Make your bedroom a comfortable place where it is easy to fall asleep:  Put up shades or special blackout curtains to block light from outside.  Use a white noise machine to block noise.  Keep the temperature cool.   Exercise regularly as directed by your health care provider. Avoid exercising right before bedtime.  Use relaxation techniques to manage stress. Ask your health care provider to suggest some techniques that may work well for you. These may include:  Breathing exercises.  Routines to release muscle tension.  Visualizing peaceful scenes.  Cut back on alcohol, caffeinated beverages, and cigarettes, especially close to bedtime. These can disrupt your sleep.  Do not overeat or eat spicy foods right before bedtime. This can lead to digestive discomfort that can make it hard for you to sleep.  Limit screen use before bedtime. This includes:  Watching TV.  Using your smartphone, tablet, and computer.  Stick to a routine. This can help you fall asleep faster. Try to do a quiet activity,  brush your teeth, and go to bed at the same time each night.  Get out of bed if you are still awake after 15 minutes of trying to sleep. Keep the lights down, but try reading or doing a quiet activity. When you feel sleepy, go back to bed.  Make sure that you drive carefully. Avoid driving if you feel very sleepy.  Keep all follow-up appointments as directed by your health care provider. This is important. SEEK MEDICAL CARE IF:   You are tired throughout the day or have trouble in your daily routine due to sleepiness.  You continue to have sleep problems or your sleep problems get worse. SEEK IMMEDIATE MEDICAL CARE IF:   You have serious thoughts about hurting yourself or someone else.   This information is not intended to replace advice given to you by your health care provider. Make sure you discuss any questions you have with your health care provider.   Document Released: 08/26/2000 Document Revised: 05/20/2015 Document Reviewed: 05/30/2014 Elsevier Interactive Patient Education [employer].

## 2016-03-25 NOTE — Assessment & Plan Note (Addendum)
Symptoms seemed to be related to insomnia.  OK for Clonazepam but discussed sleep hygeine measures with written instructions.  Continue Zoloft

## 2016-03-25 NOTE — Progress Notes (Signed)
   BP 99/69 mmHg  Pulse 71  Temp(Src) 97.7 F (36.5 C)  Ht 5' 9.8" (1.773 m)  Wt 165 lb 3.2 oz (74.934 kg)  BMI 23.84 kg/m2  SpO2 98%   Subjective:    Patient ID: Virginia Austin, female    DOB: 1966-04-18, 50 y.o.   MRN: [id number]  HPI: Virginia Austin is a 50 y.o. female  Chief Complaint  Patient presents with  . Depression    pt states she would like medications changed    Depression Pt states she doesn't feel like her medications are working.  States she is irritable, frustrated, anxious and frustrated.  States one issue is that she has trouble sleeping at night due to hot flashes and turning mind off.  States she can't relax.  When she takes the Clonazepam she sleeps well.  She has had sleep problems for a while.    Pt states she is a wife and mother with 3 adopted children under 18 and 2 with ADHD.    Relevant past medical, surgical, family and social history reviewed and updated as indicated. Interim medical history since our last visit reviewed. Allergies and medications reviewed and updated.  Review of Systems  Per HPI unless specifically indicated above     Objective:    BP 99/69 mmHg  Pulse 71  Temp(Src) 97.7 F (36.5 C)  Ht 5' 9.8" (1.773 m)  Wt 165 lb 3.2 oz (74.934 kg)  BMI 23.84 kg/m2  SpO2 98%  Wt Readings from Last 3 Encounters:  03/25/16 165 lb 3.2 oz (74.934 kg)  02/09/16 165 lb 12.8 oz (75.206 kg)  11/18/15 170 lb (77.111 kg)    Physical Exam  Constitutional: She is oriented to person, place, and time. She appears well-developed and well-nourished. No distress.  HENT:  Head: Normocephalic and atraumatic.  Eyes: Conjunctivae and lids are normal. Right eye exhibits no discharge. Left eye exhibits no discharge. No scleral icterus.  Cardiovascular: Normal rate.   Pulmonary/Chest: Effort normal.  Abdominal: Normal appearance. There is no splenomegaly or hepatomegaly.  Musculoskeletal: Normal range of motion.  Neurological: She is alert and oriented  to person, place, and time.  Skin: Skin is intact. No rash noted. No pallor.  Psychiatric: She has a normal mood and affect. Her behavior is normal. Judgment and thought content normal.       Assessment & Plan:   Problem List Items Addressed This Visit      Unprioritized   Insomnia - Primary    Symptoms seemed to be related to insomnia.  OK for Clonazepam but discussed sleep hygeine measures with written instructions.  Continue Zoloft          Follow up plan: Return if symptoms worsen or fail to improve.

## 2016-05-24 ENCOUNTER — Other Ambulatory Visit: Payer: Self-pay | Admitting: Unknown Physician Specialty

## 2016-09-07 ENCOUNTER — Other Ambulatory Visit: Payer: Self-pay

## 2016-09-08 MED ORDER — SERTRALINE HCL 50 MG PO TABS: ORAL_TABLET | ORAL | 0 refills | Status: DC

## 2016-09-21 ENCOUNTER — Telehealth: Payer: Self-pay | Admitting: Unknown Physician Specialty

## 2016-09-21 MED ORDER — CLONAZEPAM 0.5 MG PO TABS: 0.5000 mg | ORAL_TABLET | Freq: Every day | ORAL | 0 refills | Status: DC

## 2016-09-21 NOTE — Telephone Encounter (Signed)
OK, but pt also needs to be seen

## 2016-09-21 NOTE — Telephone Encounter (Signed)
Called and scheduled patient a f/up visit for 10/04/16 and asked patient which pharmacy she would like her prescription faxed to. She stated the [facility] in [place].

## 2016-09-21 NOTE — Telephone Encounter (Signed)
Routing to provider  

## 2016-10-04 ENCOUNTER — Encounter: Payer: Self-pay | Admitting: Unknown Physician Specialty

## 2016-10-04 ENCOUNTER — Ambulatory Visit (INDEPENDENT_AMBULATORY_CARE_PROVIDER_SITE_OTHER): Payer: Managed Care, Other (non HMO) | Admitting: Unknown Physician Specialty

## 2016-10-04 DIAGNOSIS — F32A Depression, unspecified: Secondary | ICD-10-CM | POA: Insufficient documentation

## 2016-10-04 DIAGNOSIS — F331 Major depressive disorder, recurrent, moderate: Secondary | ICD-10-CM

## 2016-10-04 DIAGNOSIS — F329 Major depressive disorder, single episode, unspecified: Secondary | ICD-10-CM | POA: Insufficient documentation

## 2016-10-04 MED ORDER — SERTRALINE HCL 50 MG PO TABS: 100.0000 mg | ORAL_TABLET | Freq: Every day | ORAL | 3 refills | Status: DC

## 2016-10-04 NOTE — Progress Notes (Signed)
BP 107/72 (BP Location: Left Arm, Cuff Size: Large)   Pulse 77   Temp 97.7 F (36.5 C)   Ht 5' 10.5" (1.791 m) Comment: pt had shoes on  Wt 169 lb (76.7 kg) Comment: pt had shoes on  SpO2 96%   BMI 23.91 kg/m    Subjective:    Patient ID: Virginia Austin, female    DOB: Aug 08, 1966, 51 y.o.   MRN: [id number]  HPI: Virginia Austin is a 51 y.o. female  Chief Complaint  Patient presents with  . Depression    pt states she would like to change sertraline, states she feels like it is not working   Depression Taking Zoloft currently.  She has 3 small children.  Feeling irritable and tired.  She has an 11,10, and 52 y.o.  The older one is being tested for autism.  States she gets hot at night and throwing covers off.  She has trouble going back to sleep.   She is not on an exercise program.  States she is edgy and irritable much of the time and would like to sleep through the night.  States the Zoloft worked for her at one time but not anymore.  Takes Clonazepam about twice a week at night.    Depression screen [facility] 2/9 10/04/2016  Decreased Interest 0  Down, Depressed, Hopeless 0  PHQ - 2 Score 0  Altered sleeping 2  Tired, decreased energy 2  Change in appetite 2  Feeling bad or failure about yourself  0  Trouble concentrating 0  Moving slowly or fidgety/restless 0  Suicidal thoughts 0  PHQ-9 Score 6    Relevant past medical, surgical, family and social history reviewed and updated as indicated. Interim medical history since our last visit reviewed. Allergies and medications reviewed and updated.  Review of Systems  Per HPI unless specifically indicated above     Objective:    BP 107/72 (BP Location: Left Arm, Cuff Size: Large)   Pulse 77   Temp 97.7 F (36.5 C)   Ht 5' 10.5" (1.791 m) Comment: pt had shoes on  Wt 169 lb (76.7 kg) Comment: pt had shoes on  SpO2 96%   BMI 23.91 kg/m   Wt Readings from Last 3 Encounters:  10/04/16 169 lb (76.7 kg)  03/25/16 165 lb 3.2 oz  (74.9 kg)  02/09/16 165 lb 12.8 oz (75.2 kg)    Physical Exam  Constitutional: She is oriented to person, place, and time. She appears well-developed.  HENT:  Head: Normocephalic.  Cardiovascular: Normal rate, regular rhythm and normal heart sounds.   Pulmonary/Chest: Effort normal and breath sounds normal.  Abdominal: Soft. Bowel sounds are normal.  Neurological: She is alert and oriented to person, place, and time.  Skin: Skin is warm and dry.    Results for orders placed or performed during the hospital encounter of 11/18/15  CBC  Result Value Ref Range   WBC 5.8 4.0 - 10.5 K/uL   RBC 3.93 3.87 - 5.11 MIL/uL   Hemoglobin 12.1 12.0 - 15.0 g/dL   HCT 35.9 (L) 36.0 - 46.0 %   MCV 91.3 78.0 - 100.0 fL   MCH 30.8 26.0 - 34.0 pg   MCHC 33.7 30.0 - 36.0 g/dL   RDW 13.1 11.5 - 15.5 %   Platelets 330 150 - 400 K/uL      Assessment & Plan:   Problem List Items Addressed This Visit    None  Follow up plan: Return in about 4 weeks (around 11/01/2016).

## 2016-10-04 NOTE — Assessment & Plan Note (Signed)
Patient will continue on sertraline at an increased dosage of 100 mg/day. Also encouraged to try to begin an exercise regimen and to delegate more tasks to her husband in order to decrease her current work load and stress level.

## 2016-11-01 ENCOUNTER — Encounter: Payer: Self-pay | Admitting: Unknown Physician Specialty

## 2016-11-01 ENCOUNTER — Ambulatory Visit (INDEPENDENT_AMBULATORY_CARE_PROVIDER_SITE_OTHER): Payer: Managed Care, Other (non HMO) | Admitting: Unknown Physician Specialty

## 2016-11-01 VITALS — BP 102/70 | HR 79 | Temp 98.4°F | Wt 169.4 lb

## 2016-11-01 DIAGNOSIS — F321 Major depressive disorder, single episode, moderate: Secondary | ICD-10-CM | POA: Diagnosis not present

## 2016-11-01 MED ORDER — VENLAFAXINE HCL ER 75 MG PO CP24: 75.0000 mg | ORAL_CAPSULE | Freq: Every day | ORAL | 2 refills | Status: DC

## 2016-11-01 NOTE — Progress Notes (Signed)
   BP 102/70 (BP Location: Left Arm, Patient Position: Sitting, Cuff Size: Large)   Pulse 79   Temp 98.4 F (36.9 C)   Wt 169 lb 6.4 oz (76.8 kg)   SpO2 97%   BMI 23.96 kg/m    Subjective:    Patient ID: Virginia Austin, female    DOB: [birth year], 51 y.o.   MRN: [id number]  HPI: Virginia Austin is a 51 y.o. female  Chief Complaint  Patient presents with  . Depression    4 week f/up   Depression Increased to 100 mg of Sertraline without any changes.  She states she would like to sleep through the night without being irritable and grouchy and grumpy.  She has more help but that doesn't seem to help.  Takes Clonazepam QHS.   Depression screen [facility] 2/9 11/01/2016 10/04/2016  Decreased Interest 0 0  Down, Depressed, Hopeless 0 0  PHQ - 2 Score 0 0  Altered sleeping 1 2  Tired, decreased energy 1 2  Change in appetite 0 2  Feeling bad or failure about yourself  0 0  Trouble concentrating 0 0  Moving slowly or fidgety/restless 0 0  Suicidal thoughts 0 0  PHQ-9 Score 2 6    Relevant past medical, surgical, family and social history reviewed and updated as indicated. Interim medical history since our last visit reviewed. Allergies and medications reviewed and updated.  Review of Systems  Per HPI unless specifically indicated above     Objective:    BP 102/70 (BP Location: Left Arm, Patient Position: Sitting, Cuff Size: Large)   Pulse 79   Temp 98.4 F (36.9 C)   Wt 169 lb 6.4 oz (76.8 kg)   SpO2 97%   BMI 23.96 kg/m   Wt Readings from Last 3 Encounters:  11/01/16 169 lb 6.4 oz (76.8 kg)  10/04/16 169 lb (76.7 kg)  03/25/16 165 lb 3.2 oz (74.9 kg)    Physical Exam  Constitutional: She is oriented to person, place, and time. She appears well-developed and well-nourished. No distress.  HENT:  Head: Normocephalic and atraumatic.  Eyes: Conjunctivae and lids are normal. Right eye exhibits no discharge. Left eye exhibits no discharge. No scleral icterus.  Neck: Normal range of  motion. Neck supple. No JVD present. Carotid bruit is not present.  Cardiovascular: Normal rate, regular rhythm and normal heart sounds.   Pulmonary/Chest: Effort normal and breath sounds normal.  Abdominal: Normal appearance. There is no splenomegaly or hepatomegaly.  Musculoskeletal: Normal range of motion.  Neurological: She is alert and oriented to person, place, and time.  Skin: Skin is warm, dry and intact. No rash noted. No pallor.  Psychiatric: She has a normal mood and affect. Her behavior is normal. Judgment and thought content normal.       Assessment & Plan:    Depression  DC Zoloft.  Start Effexor XR 75 mg  Follow up plan: Return in about 4 weeks (around 11/29/2016).

## 2016-11-01 NOTE — Assessment & Plan Note (Signed)
Change from Zoloft to Effexor XR 75 mg.

## 2016-12-08 ENCOUNTER — Telehealth: Payer: Self-pay | Admitting: Unknown Physician Specialty

## 2016-12-08 NOTE — Telephone Encounter (Signed)
Patient would like to  increase her Venlafaxine 75mg  1 a day to 100mg  or 125mg  daily.    Thanks     Ph[facility]acy [facility] on [address]

## 2016-12-09 MED ORDER — VENLAFAXINE HCL ER 150 MG PO CP24: 150.0000 mg | ORAL_CAPSULE | Freq: Every day | ORAL | 2 refills | Status: DC

## 2016-12-09 NOTE — Telephone Encounter (Signed)
Called and let patient know that new prescription was sent in for her.

## 2016-12-09 NOTE — Telephone Encounter (Signed)
Routing to provider  

## 2017-01-26 ENCOUNTER — Other Ambulatory Visit: Payer: Self-pay | Admitting: Unknown Physician Specialty

## 2017-02-26 ENCOUNTER — Other Ambulatory Visit: Payer: Self-pay | Admitting: Unknown Physician Specialty

## 2017-05-23 ENCOUNTER — Other Ambulatory Visit: Payer: Self-pay

## 2017-05-23 MED ORDER — VENLAFAXINE HCL ER 150 MG PO CP24: 150.0000 mg | ORAL_CAPSULE | Freq: Every day | ORAL | 2 refills | Status: DC

## 2017-05-23 NOTE — Telephone Encounter (Signed)
Patient last seen 10/2016.

## 2017-06-15 ENCOUNTER — Ambulatory Visit (INDEPENDENT_AMBULATORY_CARE_PROVIDER_SITE_OTHER): Payer: Managed Care, Other (non HMO) | Admitting: Family Medicine

## 2017-06-15 ENCOUNTER — Encounter: Payer: Self-pay | Admitting: Family Medicine

## 2017-06-15 VITALS — BP 120/82 | HR 87 | Temp 99.5°F | Wt 166.0 lb

## 2017-06-15 DIAGNOSIS — F419 Anxiety disorder, unspecified: Secondary | ICD-10-CM

## 2017-06-15 DIAGNOSIS — F331 Major depressive disorder, recurrent, moderate: Secondary | ICD-10-CM

## 2017-06-15 MED ORDER — HYDROXYZINE HCL 25 MG PO TABS: 25.0000 mg | ORAL_TABLET | Freq: Three times a day (TID) | ORAL | 0 refills | Status: DC | PRN

## 2017-06-15 MED ORDER — BUSPIRONE HCL 10 MG PO TABS
ORAL_TABLET | ORAL | 1 refills | Status: DC
Start: 2017-06-15 — End: 2017-07-21

## 2017-06-15 NOTE — Assessment & Plan Note (Signed)
Start buspar, hydroxyzine prn. Sedation precautions reviewed. F/u in one month

## 2017-06-15 NOTE — Progress Notes (Signed)
   BP 120/82   Pulse 87   Temp 99.5 F (37.5 C)   Wt 166 lb (75.3 kg)   SpO2 97%   BMI 23.48 kg/m    Subjective:    Patient ID: Virginia Austin, female    DOB: July 05, 1966, 51 y.o.   MRN: [id number]  HPI: Virginia Austin is a 51 y.o. female  Chief Complaint  Patient presents with  . Depression    had been on Sertraline in the past. wants to try something different  . Anxiety   Patient presents today for anxiety/depression f/u. Has been on effexor, dose recently increased with no noticed benefit. Tried sertraline in the past and was unhappy with that. Was given klonopin earlier this year but does not wish to continue. Feels agitated nearly all the time, very anxious. Is going to a counselor weekly through the [facility] program in [place] for her son's Autism which she feels is helping. Denies SI/HI, sleep disturbance, appetite changes.   Relevant past medical, surgical, family and social history reviewed and updated as indicated. Interim medical history since our last visit reviewed. Allergies and medications reviewed and updated.  Review of Systems  Constitutional: Negative.   Respiratory: Negative.   Cardiovascular: Negative.   Gastrointestinal: Negative.   Genitourinary: Negative.   Musculoskeletal: Negative.   Neurological: Negative.   Psychiatric/Behavioral: Positive for agitation and dysphoric mood. The patient is nervous/anxious.    Per HPI unless specifically indicated above     Objective:    BP 120/82   Pulse 87   Temp 99.5 F (37.5 C)   Wt 166 lb (75.3 kg)   SpO2 97%   BMI 23.48 kg/m   Wt Readings from Last 3 Encounters:  06/15/17 166 lb (75.3 kg)  11/01/16 169 lb 6.4 oz (76.8 kg)  10/04/16 169 lb (76.7 kg)    Physical Exam  Constitutional: She is oriented to person, place, and time. She appears well-developed and well-nourished. No distress.  HENT:  Head: Atraumatic.  Eyes: Pupils are equal, round, and reactive to light. Conjunctivae are normal.  Neck:  Normal range of motion. Neck supple.  Cardiovascular: Normal rate and normal heart sounds.   Pulmonary/Chest: Effort normal and breath sounds normal. No respiratory distress.  Musculoskeletal: Normal range of motion.  Neurological: She is alert and oriented to person, place, and time.  Skin: Skin is warm and dry.  Psychiatric: She has a normal mood and affect. Her behavior is normal.  Nursing note and vitals reviewed.     Assessment & Plan:   Problem List Items Addressed This Visit      Other   Depression - Primary    Will taper off effexor as she notes no clear benefit, titrate up buspar concurrently. Continue weekly counseling. F/u in one month. Risks and benefits reviewed, pt agreeable      Relevant Medications   busPIRone (BUSPAR) 10 MG tablet   hydrOXYzine (ATARAX/VISTARIL) 25 MG tablet   Anxiety    Start buspar, hydroxyzine prn. Sedation precautions reviewed. F/u in one month      Relevant Medications   busPIRone (BUSPAR) 10 MG tablet   hydrOXYzine (ATARAX/VISTARIL) 25 MG tablet       Follow up plan: Return in about 4 weeks (around 07/13/2017) for Anxiety f/u.

## 2017-06-15 NOTE — Assessment & Plan Note (Signed)
Will taper off effexor as she notes no clear benefit, titrate up buspar concurrently. Continue weekly counseling. F/u in one month. Risks and benefits reviewed, pt agreeable

## 2017-06-15 NOTE — Patient Instructions (Signed)
Follow up in 1 month   

## 2017-06-27 ENCOUNTER — Telehealth: Payer: Self-pay | Admitting: Family Medicine

## 2017-06-27 NOTE — Telephone Encounter (Signed)
Patient stopped in office to advise that the medication she was prescribed during her last visit is not working.  Patient states that it caused her to feel very groggy and anxious.  Patient would like a new RX called in to [facility].  Pt can be reached @ [phone].

## 2017-06-27 NOTE — Telephone Encounter (Signed)
Routing to provider  

## 2017-06-28 MED ORDER — BUPROPION HCL ER (SR) 150 MG PO TB12: 150.0000 mg | ORAL_TABLET | Freq: Every day | ORAL | 0 refills | Status: DC

## 2017-06-28 NOTE — Telephone Encounter (Signed)
Please call and let her know I've called in Wellbutrin for her to try instead of what she's currently prescribed as she is having side effects. Go ahead and move appt back to 1 month after starting the medication to give it some time. Call with side effects in the meantime.

## 2017-06-28 NOTE — Telephone Encounter (Signed)
Left message to call.

## 2017-06-28 NOTE — Telephone Encounter (Signed)
Patient notified and appt rescheduled.

## 2017-07-14 ENCOUNTER — Telehealth: Payer: Self-pay | Admitting: Family Medicine

## 2017-07-14 NOTE — Telephone Encounter (Signed)
Routing to provider for advice. Next appt on 07/28/17.

## 2017-07-14 NOTE — Telephone Encounter (Signed)
Copied from [facility] [phone]. Topic: Quick Communication - See Telephone Encounter >> Jul 14, 2017  3:53 PM Robina Ade, Helene Kelp D wrote: CRM for notification. See Telephone encounter for: 07/14/17. Patient said she was given buPROPion (WELLBUTRIN SR) 150 MG 12 hr tablet and it is not working and wants to know is there something else she can take or talk to the provider about it. Please call patient back, thanks.

## 2017-07-17 ENCOUNTER — Ambulatory Visit: Payer: Managed Care, Other (non HMO) | Admitting: Family Medicine

## 2017-07-17 NOTE — Telephone Encounter (Signed)
Has appt scheduled in a week or so - have already changed medications since initial appt, pt needs to wait and discuss at follow up. If causing side effects, can start slowly tapering off of it.

## 2017-07-17 NOTE — Telephone Encounter (Signed)
Patient notified. Appt time moved up.

## 2017-07-20 ENCOUNTER — Ambulatory Visit: Payer: Self-pay | Admitting: Family Medicine

## 2017-07-21 ENCOUNTER — Ambulatory Visit: Payer: Managed Care, Other (non HMO) | Admitting: Family Medicine

## 2017-07-21 ENCOUNTER — Other Ambulatory Visit: Payer: Self-pay | Admitting: Family Medicine

## 2017-07-21 ENCOUNTER — Encounter: Payer: Self-pay | Admitting: Family Medicine

## 2017-07-21 VITALS — BP 108/71 | HR 80 | Temp 97.8°F | Wt 167.0 lb

## 2017-07-21 DIAGNOSIS — F331 Major depressive disorder, recurrent, moderate: Secondary | ICD-10-CM

## 2017-07-21 MED ORDER — BUPROPION HCL ER (XL) 300 MG PO TB24: 300.0000 mg | ORAL_TABLET | Freq: Every day | ORAL | 1 refills | Status: DC

## 2017-07-21 NOTE — Progress Notes (Signed)
   BP 108/71 (BP Location: Right Arm, Patient Position: Sitting, Cuff Size: Normal)   Pulse 80   Temp 97.8 F (36.6 C)   Wt 167 lb (75.8 kg)   SpO2 99%   BMI 23.62 kg/m    Subjective:    Patient ID: Virginia Austin, female    DOB: [birth year], 51 y.o.   MRN: [id number]  HPI: Virginia Austin is a 51 y.o. female  Chief Complaint  Patient presents with  . Depression    Patient states she hasn't noticed a difference. Not worse, but not better.    Patient presents for 1 month depression/anxiety f/u. Was started on buspar and hydroxyzine originally but adamantly didn't like either. Switched to wellbutrin per pt request about 2 weeks ago. Notes no noticed benefit with wellbutrin 150 mg. No side effects or worsening, just still having the agitation and constantly feeling keyed up. Denies SI/HI, sleep disturbance, appetite issues. Still attending regular counseling sessions which are very helpful to her.   Relevant past medical, surgical, family and social history reviewed and updated as indicated. Interim medical history since our last visit reviewed. Allergies and medications reviewed and updated.  Review of Systems  Constitutional: Negative.   Respiratory: Negative.   Cardiovascular: Negative.   Gastrointestinal: Negative.   Musculoskeletal: Negative.   Neurological: Negative.   Psychiatric/Behavioral: Positive for agitation and dysphoric mood. The patient is nervous/anxious.    Per HPI unless specifically indicated above     Objective:    BP 108/71 (BP Location: Right Arm, Patient Position: Sitting, Cuff Size: Normal)   Pulse 80   Temp 97.8 F (36.6 C)   Wt 167 lb (75.8 kg)   SpO2 99%   BMI 23.62 kg/m   Wt Readings from Last 3 Encounters:  07/21/17 167 lb (75.8 kg)  06/15/17 166 lb (75.3 kg)  11/01/16 169 lb 6.4 oz (76.8 kg)    Physical Exam  Constitutional: She is oriented to person, place, and time. She appears well-developed and well-nourished. No distress.  HENT:  Head:  Atraumatic.  Eyes: Conjunctivae are normal. No scleral icterus.  Neck: Normal range of motion. Neck supple.  Cardiovascular: Normal rate and normal heart sounds.  Pulmonary/Chest: Effort normal and breath sounds normal. No respiratory distress.  Musculoskeletal: Normal range of motion.  Neurological: She is alert and oriented to person, place, and time.  Skin: Skin is warm and dry.  Psychiatric: She has a normal mood and affect. Judgment and thought content normal.  Nursing note and vitals reviewed.     Assessment & Plan:   Problem List Items Addressed This Visit      Other   Depression - Primary    Pt has now failed multiple SSRIs and hydroxyzine, discussed options of starting something new or increasing wellbutrin and giving things more time. Pt opting to increase wellbutrin and continuing to monitor. If no benefit after 1 month, will discuss changing to something new. Continue counseling sessions. F/u if side effects or worsening sxs.       Relevant Medications   buPROPion (WELLBUTRIN XL) 300 MG 24 hr tablet       Follow up plan: Return in about 4 weeks (around 08/18/2017) for Depression.

## 2017-07-21 NOTE — Patient Instructions (Signed)
Follow up in 1 month   

## 2017-07-24 NOTE — Assessment & Plan Note (Signed)
Pt has now failed multiple SSRIs and hydroxyzine, discussed options of starting something new or increasing wellbutrin and giving things more time. Pt opting to increase wellbutrin and continuing to monitor. If no benefit after 1 month, will discuss changing to something new. Continue counseling sessions. F/u if side effects or worsening sxs.

## 2017-07-28 ENCOUNTER — Ambulatory Visit: Payer: Self-pay | Admitting: Family Medicine

## 2017-08-16 ENCOUNTER — Telehealth: Payer: Self-pay | Admitting: Unknown Physician Specialty

## 2017-08-16 NOTE — Telephone Encounter (Signed)
Sending to Apolonio Schneiders who has been working with her and it is not on her med list

## 2017-08-16 NOTE — Telephone Encounter (Signed)
[facility] would like to request a refill for venlafaxine HCl 150 mg 24hr capsule.

## 2017-08-17 NOTE — Telephone Encounter (Signed)
This is not the plan we had discussed at her last visit. She needs to wait for her OV to make any further changes.

## 2017-08-18 ENCOUNTER — Other Ambulatory Visit: Payer: Self-pay | Admitting: Unknown Physician Specialty

## 2017-08-22 ENCOUNTER — Ambulatory Visit: Payer: Managed Care, Other (non HMO) | Admitting: Family Medicine

## 2017-08-29 ENCOUNTER — Encounter: Payer: Self-pay | Admitting: Family Medicine

## 2017-08-29 ENCOUNTER — Ambulatory Visit: Payer: Managed Care, Other (non HMO) | Admitting: Family Medicine

## 2017-08-29 VITALS — BP 112/75 | HR 77 | Temp 97.9°F | Wt 170.8 lb

## 2017-08-29 DIAGNOSIS — F331 Major depressive disorder, recurrent, moderate: Secondary | ICD-10-CM

## 2017-08-29 MED ORDER — LURASIDONE HCL 20 MG PO TABS: 20.0000 mg | ORAL_TABLET | Freq: Every day | ORAL | 1 refills | Status: DC

## 2017-08-29 NOTE — Assessment & Plan Note (Signed)
Has now failed numerous SSRIs and Wellbutrin. Will do trial of latuda in addition to her regular counseling. Risks and benefits reviewed, follow up in 1 month for recheck. Taper off wellbutrin slowly in meantime.

## 2017-08-29 NOTE — Patient Instructions (Signed)
Follow up in 1 month   

## 2017-08-29 NOTE — Progress Notes (Signed)
   BP 112/75   Pulse 77   Temp 97.9 F (36.6 C) (Oral)   Wt 170 lb 12.8 oz (77.5 kg)   SpO2 99%   BMI 24.16 kg/m    Subjective:    Patient ID: Virginia Austin, female    DOB: [birth year], 51 y.o.   MRN: [id number]  HPI: Virginia Austin Virginia Austin is a 51 y.o. female  Chief Complaint  Patient presents with  . Depression    4 week f/up    Patient presents for 1 month f/u for depression. Was increased last month to 300 mg on the wellbutrin as she felt no benefit from the 150 mg dose, no improvement on high dose. No side effects noted. Failed numerous SSRIs in the past. Still going to regular counseling sessions which she finds very helpful. No sleep or appetite issues or SI, just very agitated and on edge at all times, dysphoric moods.   Relevant past medical, surgical, family and social history reviewed and updated as indicated. Interim medical history since our last visit reviewed. Allergies and medications reviewed and updated.  Review of Systems  Constitutional: Negative.   Eyes: Negative.   Respiratory: Negative.   Cardiovascular: Negative.   Gastrointestinal: Negative.   Genitourinary: Negative.   Musculoskeletal: Negative.   Neurological: Negative.   Psychiatric/Behavioral: Positive for agitation and dysphoric mood.   Per HPI unless specifically indicated above     Objective:    BP 112/75   Pulse 77   Temp 97.9 F (36.6 C) (Oral)   Wt 170 lb 12.8 oz (77.5 kg)   SpO2 99%   BMI 24.16 kg/m   Wt Readings from Last 3 Encounters:  08/29/17 170 lb 12.8 oz (77.5 kg)  07/21/17 167 lb (75.8 kg)  06/15/17 166 lb (75.3 kg)    Physical Exam  Constitutional: She is oriented to person, place, and time. She appears well-developed and well-nourished. No distress.  HENT:  Head: Atraumatic.  Eyes: Conjunctivae are normal. Pupils are equal, round, and reactive to light.  Neck: Normal range of motion. Neck supple.  Cardiovascular: Normal rate and normal heart sounds.  Pulmonary/Chest:  Effort normal and breath sounds normal. No respiratory distress.  Musculoskeletal: Normal range of motion.  Neurological: She is alert and oriented to person, place, and time.  Skin: Skin is warm and dry.  Psychiatric: She has a normal mood and affect. Her behavior is normal.  Nursing note and vitals reviewed.     Assessment & Plan:   Problem List Items Addressed This Visit      Other   Depression - Primary    Has now failed numerous SSRIs and Wellbutrin. Will do trial of latuda in addition to her regular counseling. Risks and benefits reviewed, follow up in 1 month for recheck. Taper off wellbutrin slowly in meantime.           Follow up plan: Return in about 4 weeks (around 09/26/2017) for Depression f/u.

## 2017-09-29 ENCOUNTER — Encounter: Payer: Self-pay | Admitting: Family Medicine

## 2017-09-29 ENCOUNTER — Ambulatory Visit: Payer: Managed Care, Other (non HMO) | Admitting: Family Medicine

## 2017-09-29 VITALS — BP 104/70 | HR 70 | Temp 97.7°F | Ht 71.0 in | Wt 170.1 lb

## 2017-09-29 DIAGNOSIS — F331 Major depressive disorder, recurrent, moderate: Secondary | ICD-10-CM

## 2017-09-29 DIAGNOSIS — M1710 Unilateral primary osteoarthritis, unspecified knee: Secondary | ICD-10-CM | POA: Diagnosis not present

## 2017-09-29 MED ORDER — LURASIDONE HCL 40 MG PO TABS: 40.0000 mg | ORAL_TABLET | Freq: Every day | ORAL | 3 refills | Status: DC

## 2017-09-29 NOTE — Progress Notes (Addendum)
BP 104/70 (BP Location: Right Arm, Patient Position: Sitting, Cuff Size: Normal)   Pulse 70   Temp 97.7 F (36.5 C) (Oral)   Ht 5\' 11"  (1.803 m)   Wt 170 lb 1.6 oz (77.2 kg)   SpO2 100%   BMI 23.72 kg/m    Subjective:    Patient ID: Virginia Austin, female    DOB: 1966-09-08, 52 y.o.   MRN: [id number]  HPI: Virginia Austin is a 51 y.o. female  Chief Complaint  Patient presents with  . Depression    1 month follow-up. Patient states no complaints.   Pt here today for 1 month f/u depression. Doing well on latuda without any side effects. Still attends regular counseling as well. No SI/HI, severe mood swings, uncontrollable agitation.   Depression screen [facility] 2/9 09/29/2017 08/29/2017 06/15/2017  Decreased Interest 0 0 0  Down, Depressed, Hopeless 0 0 0  PHQ - 2 Score 0 0 0  Altered sleeping 0 0 -  Tired, decreased energy 0 1 -  Change in appetite 0 0 -  Feeling bad or failure about yourself  0 0 -  Trouble concentrating 0 0 -  Moving slowly or fidgety/restless 0 0 -  Suicidal thoughts 0 0 -  PHQ-9 Score 0 1 -    Popping knees going up and down stairs. This has been ongoing for years but lately has been worse. Has not tried any OTC remedies. Is very active in general.   Past Medical History:  Diagnosis Date  . Anxiety   . Chronic headaches    otc med prn  . Depression   . Dysthymic disorder   . Hyperlipidemia    diet controlled, no meds  . Impaired fasting glucose   . Lumbago   . Neck sprain and strain    Hx  . Vitamin D deficiency    Hx - resolved per patient   Social History   Socioeconomic History  . Marital status: Married    Spouse name: Not on file  . Number of children: Not on file  . Years of education: Not on file  . Highest education level: Not on file  Social Needs  . Financial resource strain: Not on file  . Food insecurity - worry: Not on file  . Food insecurity - inability: Not on file  . Transportation needs - medical: Not on file  .  Transportation needs - non-medical: Not on file  Occupational History  . Not on file  Tobacco Use  . Smoking status: Never Smoker  . Smokeless tobacco: Never Used  Substance and Sexual Activity  . Alcohol use: Yes    Alcohol/week: 0.6 oz    Types: 1 Cans of beer per week    Comment: occasional beer  . Drug use: No  . Sexual activity: Yes    Birth control/protection: Post-menopausal  Other Topics Concern  . Not on file  Social History Narrative  . Not on file   Relevant past medical, surgical, family and social history reviewed and updated as indicated. Interim medical history since our last visit reviewed. Allergies and medications reviewed and updated.  Review of Systems  Constitutional: Negative.   Respiratory: Negative.   Cardiovascular: Negative.   Gastrointestinal: Negative.   Musculoskeletal: Positive for arthralgias.  Skin: Negative.   Neurological: Negative.   Psychiatric/Behavioral: Negative.    Per HPI unless specifically indicated above     Objective:    BP 104/70 (BP Location: Right Arm, Patient Position:  Sitting, Cuff Size: Normal)   Pulse 70   Temp 97.7 F (36.5 C) (Oral)   Ht 5\' 11"  (1.803 m)   Wt 170 lb 1.6 oz (77.2 kg)   SpO2 100%   BMI 23.72 kg/m   Wt Readings from Last 3 Encounters:  09/29/17 170 lb 1.6 oz (77.2 kg)  08/29/17 170 lb 12.8 oz (77.5 kg)  07/21/17 167 lb (75.8 kg)    Physical Exam  Constitutional: She is oriented to person, place, and time. She appears well-developed and well-nourished. No distress.  HENT:  Head: Atraumatic.  Eyes: Conjunctivae are normal. No scleral icterus.  Neck: Normal range of motion. Neck supple.  Cardiovascular: Normal rate and normal heart sounds.  Pulmonary/Chest: Effort normal and breath sounds normal. No respiratory distress.  Musculoskeletal: Normal range of motion. She exhibits no edema or tenderness.  Mild crepitus with PROM b/l knees  Neurological: She is alert and oriented to person, place,  and time.  Skin: Skin is warm and dry. No erythema.  Psychiatric: She has a normal mood and affect. Her behavior is normal.  Nursing note and vitals reviewed.  Results for orders placed or performed during the hospital encounter of 11/18/15  CBC  Result Value Ref Range   WBC 5.8 4.0 - 10.5 K/uL   RBC 3.93 3.87 - 5.11 MIL/uL   Hemoglobin 12.1 12.0 - 15.0 g/dL   HCT 35.9 (L) 36.0 - 46.0 %   MCV 91.3 78.0 - 100.0 fL   MCH 30.8 26.0 - 34.0 pg   MCHC 33.7 30.0 - 36.0 g/dL   RDW 13.1 11.5 - 15.5 %   Platelets 330 150 - 400 K/uL      Assessment & Plan:   Problem List Items Addressed This Visit      Other   Depression - Primary    Significant improvement on latuda. Will increase to 40 mg and monitor closely for benefit. Continue regular counseling sessions. F/u with side effects or issues       Other Visit Diagnoses    Arthritis of knee       Discussed keeping weight down, staying active, tylenol or aleve prn, and starting OTC glucosamine supplements. F/u if significantly worsening       Follow up plan: Return in about 3 months (around 12/28/2017) for Depression f/u.

## 2017-10-01 NOTE — Assessment & Plan Note (Signed)
Significant improvement on latuda. Will increase to 40 mg and monitor closely for benefit. Continue regular counseling sessions. F/u with side effects or issues

## 2017-10-01 NOTE — Patient Instructions (Signed)
Follow up in 3 months

## 2017-11-08 ENCOUNTER — Telehealth: Payer: Self-pay | Admitting: Family Medicine

## 2017-11-08 NOTE — Telephone Encounter (Signed)
Rerouted to correct practice: [facility]

## 2017-11-08 NOTE — Telephone Encounter (Signed)
Copied from [place]. Topic: Quick Communication - See Telephone Encounter >> Nov 08, 2017  8:47 AM Arletha Grippe wrote: CRM for notification. See Telephone encounter for:   11/08/17. Pt is wanting to go up from 40 mg to 60 mg for lurasidone (LATUDA) 40 MG TABS tablet. She would like to see if it would help more by increasing the dose. Cb is [phone]

## 2017-11-08 NOTE — Telephone Encounter (Signed)
Pt wanting to know if dose of Latuda can be increased from 40 mg to 60 mg. Pt states she has been on new dose since 09/29/17 and while she has no new sx, she has not noticed an improvement to her current mood.  Pt asking if Latuda 60 mg can be called into [facility] in [place] LR: 09/29/17 LOV:09/29/17

## 2017-11-08 NOTE — Telephone Encounter (Signed)
Not our pt

## 2017-11-09 MED ORDER — LURASIDONE HCL 60 MG PO TABS: 1.0000 | ORAL_TABLET | Freq: Every day | ORAL | 1 refills | Status: DC

## 2017-11-09 NOTE — Telephone Encounter (Signed)
Patient notified

## 2017-11-09 NOTE — Telephone Encounter (Signed)
Rx sent to [facility], we will check in on how new dose is doing at her sheduled f/u in [place]

## 2017-11-17 ENCOUNTER — Ambulatory Visit: Payer: Managed Care, Other (non HMO) | Admitting: Family Medicine

## 2017-11-17 ENCOUNTER — Encounter: Payer: Self-pay | Admitting: Family Medicine

## 2017-11-17 VITALS — BP 104/69 | HR 69 | Temp 98.0°F | Wt 173.9 lb

## 2017-11-17 DIAGNOSIS — G8929 Other chronic pain: Secondary | ICD-10-CM

## 2017-11-17 DIAGNOSIS — F331 Major depressive disorder, recurrent, moderate: Secondary | ICD-10-CM | POA: Diagnosis not present

## 2017-11-17 DIAGNOSIS — R5383 Other fatigue: Secondary | ICD-10-CM

## 2017-11-17 DIAGNOSIS — M25561 Pain in right knee: Secondary | ICD-10-CM | POA: Diagnosis not present

## 2017-11-17 DIAGNOSIS — M25562 Pain in left knee: Secondary | ICD-10-CM | POA: Diagnosis not present

## 2017-11-17 NOTE — Progress Notes (Signed)
BP 104/69 (BP Location: Right Arm, Patient Position: Sitting, Cuff Size: Normal)   Pulse 69   Temp 98 F (36.7 C) (Oral)   Wt 173 lb 14.4 oz (78.9 kg)   SpO2 99%   BMI 24.25 kg/m    Subjective:    Patient ID: Virginia Austin, female    DOB: 07/22/1966, 52 y.o.   MRN: [id number]  HPI: Virginia Austin is a 52 y.o. female  Chief Complaint  Patient presents with  . Fatigue    Patient states she has felt very fatigued and "sluggish" ongoing for 2 months. Gradually has became worse. States has a family history of thyroid disorders.    Pt here today with 6 or so weeks of fatigue that is new for her. Has been desring naps mid day which has never been her, not wanting to exercise. Denies fevers, night sweats, weight loss, mood concerns. Doing very well with latuda for her moods, states it's the only thing that's ever helped aside from her regular counseling.   Fhx of arthritis, dad and sister both with b/l knee replacements. Taking 2 aleve BID and using knee braces with mild relief but b/l knees always ache. This has been ongoing and progressive for years. Affecting quality of life at this point, so much that they are considering building a house without stairs. Pt stays active and at a healthy weight.   Past Medical History:  Diagnosis Date  . Anxiety   . Chronic headaches    otc med prn  . Depression   . Dysthymic disorder   . Hyperlipidemia    diet controlled, no meds  . Impaired fasting glucose   . Lumbago   . Neck sprain and strain    Hx  . Vitamin D deficiency    Hx - resolved per patient   Social History   Socioeconomic History  . Marital status: Married    Spouse name: Not on file  . Number of children: Not on file  . Years of education: Not on file  . Highest education level: Not on file  Social Needs  . Financial resource strain: Not on file  . Food insecurity - worry: Not on file  . Food insecurity - inability: Not on file  . Transportation needs - medical: Not on  file  . Transportation needs - non-medical: Not on file  Occupational History  . Not on file  Tobacco Use  . Smoking status: Never Smoker  . Smokeless tobacco: Never Used  Substance and Sexual Activity  . Alcohol use: Yes    Alcohol/week: 0.6 oz    Types: 1 Cans of beer per week    Comment: occasional beer  . Drug use: No  . Sexual activity: Yes    Birth control/protection: Post-menopausal  Other Topics Concern  . Not on file  Social History Narrative  . Not on file    Relevant past medical, surgical, family and social history reviewed and updated as indicated. Interim medical history since our last visit reviewed. Allergies and medications reviewed and updated.  Review of Systems  Per HPI unless specifically indicated above     Objective:    BP 104/69 (BP Location: Right Arm, Patient Position: Sitting, Cuff Size: Normal)   Pulse 69   Temp 98 F (36.7 C) (Oral)   Wt 173 lb 14.4 oz (78.9 kg)   SpO2 99%   BMI 24.25 kg/m   Wt Readings from Last 3 Encounters:  11/17/17 173 lb 14.4  oz (78.9 kg)  09/29/17 170 lb 1.6 oz (77.2 kg)  08/29/17 170 lb 12.8 oz (77.5 kg)    Physical Exam  Constitutional: She is oriented to person, place, and time. She appears well-developed and well-nourished. No distress.  HENT:  Head: Atraumatic.  Eyes: Conjunctivae are normal. Pupils are equal, round, and reactive to light.  Neck: Normal range of motion. Neck supple.  Cardiovascular: Normal rate and normal heart sounds.  Pulmonary/Chest: Effort normal and breath sounds normal. No respiratory distress.  Musculoskeletal: Normal range of motion. She exhibits no edema or deformity.  Moderate crepitus b/l knees with PROM   Neurological: She is alert and oriented to person, place, and time.  Skin: Skin is warm and dry.  Psychiatric: She has a normal mood and affect. Her behavior is normal.  Nursing note and vitals reviewed.   Results for orders placed or performed in visit on 11/17/17    CBC with Differential/Platelet  Result Value Ref Range   WBC 6.4 3.4 - 10.8 x10E3/uL   RBC 4.46 3.77 - 5.28 x10E6/uL   Hemoglobin 13.5 11.1 - 15.9 g/dL   Hematocrit 40.1 34.0 - 46.6 %   MCV 90 79 - 97 fL   MCH 30.3 26.6 - 33.0 pg   MCHC 33.7 31.5 - 35.7 g/dL   RDW 14.0 12.3 - 15.4 %   Platelets 378 150 - 379 x10E3/uL   Neutrophils 49 Not Estab. %   Lymphs 40 Not Estab. %   Monocytes 8 Not Estab. %   Eos 2 Not Estab. %   Basos 1 Not Estab. %   Neutrophils Absolute 3.1 1.4 - 7.0 x10E3/uL   Lymphocytes Absolute 2.5 0.7 - 3.1 x10E3/uL   Monocytes Absolute 0.5 0.1 - 0.9 x10E3/uL   EOS (ABSOLUTE) 0.2 0.0 - 0.4 x10E3/uL   Basophils Absolute 0.0 0.0 - 0.2 x10E3/uL   Immature Granulocytes 0 Not Estab. %   Immature Grans (Abs) 0.0 0.0 - 0.1 x10E3/uL  TSH  Result Value Ref Range   TSH 1.280 0.450 - 4.500 uIU/mL  Vitamin B12  Result Value Ref Range   Vitamin B-12 >2000 (H) 232 - 1245 pg/mL  Vitamin D (25 hydroxy)  Result Value Ref Range   Vit D, 25-Hydroxy 34.4 30.0 - 100.0 ng/mL      Assessment & Plan:   Problem List Items Addressed This Visit      Other   Depression    Dose recently increased on latuda to 60 mg. Doing very well with her moods overall. Continue current regimen       Other Visit Diagnoses    Fatigue, unspecified type    -  Primary   Will check labs to r/o common organic causes. Has been on latuda prior to sxs onset, low suspicion that it's playing a role. Await results   Relevant Orders   CBC with Differential/Platelet (Completed)   TSH (Completed)   Vitamin B12 (Completed)   Vitamin D (25 hydroxy) (Completed)   Chronic pain of both knees       Consistent with OA, will obtain b/l knee x-rays and likely refer to ortho given resistant severe sxs and fhx. Start glucosamine in addition to normal regimen       Follow up plan: Return for as scheduled for mood f/u.

## 2017-11-18 LAB — CBC WITH DIFFERENTIAL/PLATELET
BASOS ABS: 0 10*3/uL (ref 0.0–0.2)
Basos: 1 %
EOS (ABSOLUTE): 0.2 10*3/uL (ref 0.0–0.4)
Eos: 2 %
Hematocrit: 40.1 % (ref 34.0–46.6)
Hemoglobin: 13.5 g/dL (ref 11.1–15.9)
IMMATURE GRANS (ABS): 0 10*3/uL (ref 0.0–0.1)
Immature Granulocytes: 0 %
LYMPHS ABS: 2.5 10*3/uL (ref 0.7–3.1)
Lymphs: 40 %
MCH: 30.3 pg (ref 26.6–33.0)
MCHC: 33.7 g/dL (ref 31.5–35.7)
MCV: 90 fL (ref 79–97)
MONOS ABS: 0.5 10*3/uL (ref 0.1–0.9)
Monocytes: 8 %
NEUTROS ABS: 3.1 10*3/uL (ref 1.4–7.0)
Neutrophils: 49 %
Platelets: 378 10*3/uL (ref 150–379)
RBC: 4.46 x10E6/uL (ref 3.77–5.28)
RDW: 14 % (ref 12.3–15.4)
WBC: 6.4 10*3/uL (ref 3.4–10.8)

## 2017-11-18 LAB — VITAMIN D 25 HYDROXY (VIT D DEFICIENCY, FRACTURES): Vit D, 25-Hydroxy: 34.4 ng/mL (ref 30.0–100.0)

## 2017-11-18 LAB — TSH: TSH: 1.28 u[IU]/mL (ref 0.450–4.500)

## 2017-11-18 LAB — VITAMIN B12: Vitamin B-12: >2000 pg/mL — ABNORMAL HIGH (ref 232–1245)

## 2017-11-20 ENCOUNTER — Telehealth: Payer: Self-pay | Admitting: Family Medicine

## 2017-11-20 ENCOUNTER — Encounter: Payer: Self-pay | Admitting: Family Medicine

## 2017-11-20 NOTE — Telephone Encounter (Signed)
Copied from [place]. Topic: Quick Communication - Lab Results >> Nov 20, 2017 11:24 AM Burnis Medin, NT wrote: Patient called to see if her lab results were in and would like a call back.

## 2017-11-20 NOTE — Patient Instructions (Signed)
Follow up as scheduled.  

## 2017-11-20 NOTE — Telephone Encounter (Signed)
Routing to provider  

## 2017-11-20 NOTE — Telephone Encounter (Signed)
Results sent via mychart earlier this afternoon - Your labs came back fairly benign, with your vitamin D being on the low end of normal so you could stand to take a bit more over the counter and your vitamin B12 level being higher than normal, which isn't necessarily dangerous but if you're taking an extra supplement you could stop that one. Nothing here though to explain your fatigue

## 2017-11-20 NOTE — Assessment & Plan Note (Signed)
Dose recently increased on latuda to 60 mg. Doing very well with her moods overall. Continue current regimen

## 2017-11-21 ENCOUNTER — Other Ambulatory Visit: Payer: Self-pay | Admitting: Family Medicine

## 2017-11-21 MED ORDER — LURASIDONE HCL 40 MG PO TABS: 40.0000 mg | ORAL_TABLET | Freq: Every day | ORAL | 1 refills | Status: DC

## 2017-11-21 MED ORDER — BUPROPION HCL ER (XL) 150 MG PO TB24
150.0000 mg | ORAL_TABLET | Freq: Every day | ORAL | 1 refills | Status: DC
Start: 2017-11-21 — End: 2017-12-28

## 2017-11-21 NOTE — Telephone Encounter (Signed)
Spoke with patient, she has received her results yesterday afternoon.

## 2017-11-24 ENCOUNTER — Other Ambulatory Visit: Payer: Self-pay | Admitting: Family Medicine

## 2017-11-24 MED ORDER — SUVOREXANT 5 MG PO TABS: 5.0000 mg | ORAL_TABLET | Freq: Every evening | ORAL | 0 refills | Status: DC | PRN

## 2017-11-27 ENCOUNTER — Encounter: Payer: Self-pay | Admitting: Family Medicine

## 2017-11-28 ENCOUNTER — Other Ambulatory Visit: Payer: Self-pay | Admitting: Family Medicine

## 2017-11-28 MED ORDER — ZOLPIDEM TARTRATE 5 MG PO TABS: 5.0000 mg | ORAL_TABLET | Freq: Every evening | ORAL | 0 refills | Status: DC | PRN

## 2017-12-04 ENCOUNTER — Encounter: Payer: Self-pay | Admitting: Family Medicine

## 2017-12-06 ENCOUNTER — Other Ambulatory Visit: Payer: Self-pay | Admitting: Family Medicine

## 2017-12-06 MED ORDER — VENLAFAXINE HCL ER 150 MG PO CP24: 150.0000 mg | ORAL_CAPSULE | Freq: Every day | ORAL | 2 refills | Status: DC

## 2017-12-28 ENCOUNTER — Ambulatory Visit: Payer: Managed Care, Other (non HMO) | Admitting: Family Medicine

## 2017-12-28 ENCOUNTER — Encounter: Payer: Self-pay | Admitting: Family Medicine

## 2017-12-28 VITALS — BP 101/68 | HR 78 | Wt 170.7 lb

## 2017-12-28 DIAGNOSIS — G4700 Insomnia, unspecified: Secondary | ICD-10-CM | POA: Diagnosis not present

## 2017-12-28 DIAGNOSIS — F331 Major depressive disorder, recurrent, moderate: Secondary | ICD-10-CM | POA: Diagnosis not present

## 2017-12-28 MED ORDER — ESCITALOPRAM OXALATE 10 MG PO TABS: 10.0000 mg | ORAL_TABLET | Freq: Every day | ORAL | 1 refills | Status: DC

## 2017-12-28 NOTE — Progress Notes (Signed)
BP 101/68   Pulse 78   Wt 170 lb 11.2 oz (77.4 kg)   SpO2 98%   BMI 23.81 kg/m    Subjective:    Patient ID: Virginia Austin, female    DOB: [birth year], 52 y.o.   MRN: [id number]  HPI: Virginia Austin is a 52 y.o. female  Chief Complaint  Patient presents with  . Follow-up    PLEASE REVIEW MED LIST. Pt switched herself back to Lexapro  . Depression   Pt here today for depression f/u. Switched herself from effexor to lexapro about 2 weeks ago and states she is finally satisfied with the regimen. Has tried many different antidepressants the past year or so with no relief. Feels things are going the best they've gone in a long time now that she's on the lexapro. Denies SI/HI, mood swings, agitation.   Does not notice a great difference in her sleep quality with belsomra 5 mg. Did much better on the ambien and hoping to go back to that. Feels much of her mood issues come from poor sleep. Denies side effects with either medication. Has tried sleep hygiene tactics with no relief.   Past Medical History:  Diagnosis Date  . Anxiety   . Chronic headaches    otc med prn  . Depression   . Dysthymic disorder   . Hyperlipidemia    diet controlled, no meds  . Impaired fasting glucose   . Lumbago   . Neck sprain and strain    Hx  . Vitamin D deficiency    Hx - resolved per patient   Social History   Socioeconomic History  . Marital status: Married    Spouse name: Not on file  . Number of children: Not on file  . Years of education: Not on file  . Highest education level: Not on file  Occupational History  . Not on file  Social Needs  . Financial resource strain: Not on file  . Food insecurity:    Worry: Not on file    Inability: Not on file  . Transportation needs:    Medical: Not on file    Non-medical: Not on file  Tobacco Use  . Smoking status: Never Smoker  . Smokeless tobacco: Never Used  Substance and Sexual Activity  . Alcohol use: Yes    Alcohol/week: 0.6 oz   Types: 1 Cans of beer per week    Comment: occasional beer  . Drug use: No  . Sexual activity: Yes    Birth control/protection: Post-menopausal  Lifestyle  . Physical activity:    Days per week: Not on file    Minutes per session: Not on file  . Stress: Not on file  Relationships  . Social connections:    Talks on phone: Not on file    Gets together: Not on file    Attends religious service: Not on file    Active member of club or organization: Not on file    Attends meetings of clubs or organizations: Not on file    Relationship status: Not on file  . Intimate partner violence:    Fear of current or ex partner: Not on file    Emotionally abused: Not on file    Physically abused: Not on file    Forced sexual activity: Not on file  Other Topics Concern  . Not on file  Social History Narrative  . Not on file   Relevant past medical, surgical, family and social  history reviewed and updated as indicated. Interim medical history since our last visit reviewed. Allergies and medications reviewed and updated.  Review of Systems  Per HPI unless specifically indicated above     Objective:    BP 101/68   Pulse 78   Wt 170 lb 11.2 oz (77.4 kg)   SpO2 98%   BMI 23.81 kg/m   Wt Readings from Last 3 Encounters:  12/28/17 170 lb 11.2 oz (77.4 kg)  11/17/17 173 lb 14.4 oz (78.9 kg)  09/29/17 170 lb 1.6 oz (77.2 kg)    Physical Exam  Constitutional: She is oriented to person, place, and time. She appears well-developed and well-nourished. No distress.  HENT:  Head: Atraumatic.  Eyes: Pupils are equal, round, and reactive to light. Conjunctivae and EOM are normal.  Neck: Normal range of motion. Neck supple.  Cardiovascular: Normal rate, regular rhythm and normal heart sounds.  Pulmonary/Chest: Effort normal and breath sounds normal.  Musculoskeletal: Normal range of motion.  Neurological: She is alert and oriented to person, place, and time.  Skin: Skin is warm and dry.    Psychiatric: She has a normal mood and affect. Her behavior is normal.  Nursing note and vitals reviewed.   Results for orders placed or performed in visit on 11/17/17  CBC with Differential/Platelet  Result Value Ref Range   WBC 6.4 3.4 - 10.8 x10E3/uL   RBC 4.46 3.77 - 5.28 x10E6/uL   Hemoglobin 13.5 11.1 - 15.9 g/dL   Hematocrit 40.1 34.0 - 46.6 %   MCV 90 79 - 97 fL   MCH 30.3 26.6 - 33.0 pg   MCHC 33.7 31.5 - 35.7 g/dL   RDW 14.0 12.3 - 15.4 %   Platelets 378 150 - 379 x10E3/uL   Neutrophils 49 Not Estab. %   Lymphs 40 Not Estab. %   Monocytes 8 Not Estab. %   Eos 2 Not Estab. %   Basos 1 Not Estab. %   Neutrophils Absolute 3.1 1.4 - 7.0 x10E3/uL   Lymphocytes Absolute 2.5 0.7 - 3.1 x10E3/uL   Monocytes Absolute 0.5 0.1 - 0.9 x10E3/uL   EOS (ABSOLUTE) 0.2 0.0 - 0.4 x10E3/uL   Basophils Absolute 0.0 0.0 - 0.2 x10E3/uL   Immature Granulocytes 0 Not Estab. %   Immature Grans (Abs) 0.0 0.0 - 0.1 x10E3/uL  TSH  Result Value Ref Range   TSH 1.280 0.450 - 4.500 uIU/mL  Vitamin B12  Result Value Ref Range   Vitamin B-12 >2000 (H) 232 - 1245 pg/mL  Vitamin D (25 hydroxy)  Result Value Ref Range   Vit D, 25-Hydroxy 34.4 30.0 - 100.0 ng/mL      Assessment & Plan:   Problem List Items Addressed This Visit      Other   Insomnia    Will increase belsomra by 5 mg weekly to see if pt gets better relief on higher dose - knows not to take more than 3-4 tabs nightly to max dose of 20 mg. Will message on mychart if finding relief at a higher dose and I will send that dose in to pharmacy. Discussed safety risks that are greater with Lorrin Mais, will try to avoid. Continue sleep hygiene, counseling.       Depression - Primary    Pt satisfied with lexapro at current dose, will continue current regimen as well as regular counseling. F/u if worsening or no improvement      Relevant Medications   escitalopram (LEXAPRO) 10 MG tablet  Follow up plan: Return in about 6 months  (around 06/29/2018) for Mood f/u.

## 2017-12-31 NOTE — Patient Instructions (Signed)
Follow up in 6 months 

## 2017-12-31 NOTE — Assessment & Plan Note (Signed)
Will increase belsomra by 5 mg weekly to see if pt gets better relief on higher dose - knows not to take more than 3-4 tabs nightly to max dose of 20 mg. Will message on mychart if finding relief at a higher dose and I will send that dose in to pharmacy. Discussed safety risks that are greater with Lorrin Mais, will try to avoid. Continue sleep hygiene, counseling.

## 2017-12-31 NOTE — Assessment & Plan Note (Signed)
Pt satisfied with lexapro at current dose, will continue current regimen as well as regular counseling. F/u if worsening or no improvement

## 2018-03-02 ENCOUNTER — Other Ambulatory Visit: Payer: Self-pay | Admitting: Family Medicine

## 2018-03-05 ENCOUNTER — Encounter: Payer: Self-pay | Admitting: Family Medicine

## 2018-03-05 MED ORDER — SUVOREXANT 15 MG PO TABS: 15.0000 mg | ORAL_TABLET | Freq: Every evening | ORAL | 1 refills | Status: DC | PRN

## 2018-05-01 ENCOUNTER — Other Ambulatory Visit: Payer: Self-pay

## 2018-05-01 MED ORDER — ESCITALOPRAM OXALATE 10 MG PO TABS: 10.0000 mg | ORAL_TABLET | Freq: Every day | ORAL | 1 refills | Status: DC

## 2018-06-22 ENCOUNTER — Ambulatory Visit
Admission: RE | Admit: 2018-06-22 | Discharge: 2018-06-22 | Disposition: A | Discharge status: Home / Self Care | Payer: Managed Care, Other (non HMO) | Source: Ambulatory Visit | Attending: Chiropractic Medicine | Admitting: Chiropractic Medicine

## 2018-06-22 ENCOUNTER — Other Ambulatory Visit: Payer: Self-pay | Admitting: Chiropractic Medicine

## 2018-06-22 DIAGNOSIS — M545 Low back pain, unspecified: Secondary | ICD-10-CM

## 2018-06-27 ENCOUNTER — Ambulatory Visit: Payer: Managed Care, Other (non HMO) | Admitting: Family Medicine

## 2018-06-27 ENCOUNTER — Encounter: Payer: Self-pay | Admitting: Family Medicine

## 2018-06-27 VITALS — BP 117/75 | HR 68 | Wt 173.2 lb

## 2018-06-27 DIAGNOSIS — R21 Rash and other nonspecific skin eruption: Secondary | ICD-10-CM | POA: Diagnosis not present

## 2018-06-27 MED ORDER — CLOBETASOL PROPIONATE 0.05 % EX OINT: 1.0000 "application " | TOPICAL_OINTMENT | Freq: Two times a day (BID) | CUTANEOUS | 0 refills | Status: DC

## 2018-06-27 NOTE — Progress Notes (Signed)
BP 117/75   Pulse 68   Wt 173 lb 3.2 oz (78.6 kg)   SpO2 98%   BMI 24.16 kg/m    Subjective:    Patient ID: Virginia Austin, female    DOB: [birth year], 52 y.o.   MRN: [id number]  HPI: Virginia Austin is a 52 y.o. female  Chief Complaint  Patient presents with  . Insect Bite    Fire ants    Here today for painful and itchy fire ant bites on all 4 distal extremities that occurred 2 days ago. Since has had significant discomfort that is not relieved with rubbing alcohol, calamine, or hydrocortisone. Denies fevers, chills, drainage from area.   Relevant past medical, surgical, family and social history reviewed and updated as indicated. Interim medical history since our last visit reviewed. Allergies and medications reviewed and updated.  Review of Systems  Per HPI unless specifically indicated above     Objective:    BP 117/75   Pulse 68   Wt 173 lb 3.2 oz (78.6 kg)   SpO2 98%   BMI 24.16 kg/m   Wt Readings from Last 3 Encounters:  06/27/18 173 lb 3.2 oz (78.6 kg)  12/28/17 170 lb 11.2 oz (77.4 kg)  11/17/17 173 lb 14.4 oz (78.9 kg)    Physical Exam  Constitutional: She is oriented to person, place, and time. She appears well-developed and well-nourished. No distress.  HENT:  Head: Atraumatic.  Eyes: Conjunctivae and EOM are normal.  Neck: Normal range of motion. Neck supple.  Cardiovascular: Normal rate and regular rhythm.  Pulmonary/Chest: Effort normal and breath sounds normal.  Musculoskeletal: Normal range of motion.  Neurological: She is alert and oriented to person, place, and time.  Skin: Skin is warm and dry. Rash (erythematous papules, some open from scratching across all 4 distal extremities. no evidence of local infection throughout) noted.  Psychiatric: She has a normal mood and affect. Her behavior is normal.  Nursing note and vitals reviewed.   Results for orders placed or performed in visit on 11/17/17  CBC with Differential/Platelet  Result Value  Ref Range   WBC 6.4 3.4 - 10.8 x10E3/uL   RBC 4.46 3.77 - 5.28 x10E6/uL   Hemoglobin 13.5 11.1 - 15.9 g/dL   Hematocrit 40.1 34.0 - 46.6 %   MCV 90 79 - 97 fL   MCH 30.3 26.6 - 33.0 pg   MCHC 33.7 31.5 - 35.7 g/dL   RDW 14.0 12.3 - 15.4 %   Platelets 378 150 - 379 x10E3/uL   Neutrophils 49 Not Estab. %   Lymphs 40 Not Estab. %   Monocytes 8 Not Estab. %   Eos 2 Not Estab. %   Basos 1 Not Estab. %   Neutrophils Absolute 3.1 1.4 - 7.0 x10E3/uL   Lymphocytes Absolute 2.5 0.7 - 3.1 x10E3/uL   Monocytes Absolute 0.5 0.1 - 0.9 x10E3/uL   EOS (ABSOLUTE) 0.2 0.0 - 0.4 x10E3/uL   Basophils Absolute 0.0 0.0 - 0.2 x10E3/uL   Immature Granulocytes 0 Not Estab. %   Immature Grans (Abs) 0.0 0.0 - 0.1 x10E3/uL  TSH  Result Value Ref Range   TSH 1.280 0.450 - 4.500 uIU/mL  Vitamin B12  Result Value Ref Range   Vitamin B-12 >2000 (H) 232 - 1245 pg/mL  Vitamin D (25 hydroxy)  Result Value Ref Range   Vit D, 25-Hydroxy 34.4 30.0 - 100.0 ng/mL      Assessment & Plan:   Problem List Items  Addressed This Visit    None    Visit Diagnoses    Rash    -  Primary   Tx with clobetasol BID prn, wash area well with soap and water BID, apply neosporin to open areas to avoid infection. F/u if not improving over next few days       Follow up plan: Return for as scheduled in 2 days.

## 2018-06-27 NOTE — Patient Instructions (Signed)
Follow up as scheduled.  

## 2018-06-29 ENCOUNTER — Ambulatory Visit: Payer: Managed Care, Other (non HMO) | Admitting: Family Medicine

## 2018-06-29 ENCOUNTER — Encounter: Payer: Self-pay | Admitting: Family Medicine

## 2018-06-29 VITALS — BP 108/74 | HR 76 | Temp 98.6°F | Wt 171.7 lb

## 2018-06-29 DIAGNOSIS — F331 Major depressive disorder, recurrent, moderate: Secondary | ICD-10-CM

## 2018-06-29 DIAGNOSIS — F419 Anxiety disorder, unspecified: Secondary | ICD-10-CM | POA: Diagnosis not present

## 2018-06-29 DIAGNOSIS — G4700 Insomnia, unspecified: Secondary | ICD-10-CM | POA: Diagnosis not present

## 2018-06-29 MED ORDER — ESCITALOPRAM OXALATE 20 MG PO TABS: 20.0000 mg | ORAL_TABLET | Freq: Every day | ORAL | 1 refills | Status: DC

## 2018-06-29 MED ORDER — SUVOREXANT 15 MG PO TABS: 15.0000 mg | ORAL_TABLET | Freq: Every evening | ORAL | 1 refills | Status: DC | PRN

## 2018-06-29 NOTE — Progress Notes (Signed)
BP 108/74   Pulse 76   Temp 98.6 F (37 C) (Oral)   Wt 171 lb 11.2 oz (77.9 kg)   SpO2 97%   BMI 23.95 kg/m    Subjective:    Patient ID: Virginia Austin, female    DOB: [birth year], 52 y.o.   MRN: [id number]  HPI: Virginia Austin is a 52 y.o. female  Chief Complaint  Patient presents with  . Depression   Here today for mood and anxiety f/u. Doing very well on the lexapro, but hoping to increase the dose some. Has been doubling up on her 10 mg tabs and did really well on that. Feels her moods have been stable and agitation improved. Still attending regular counseling sessions. Sleeping better with prn belsomra. Denies SI/HI.   Depression screen [facility] 2/9 06/29/2018 06/27/2018 12/28/2017  Decreased Interest 0 0 0  Down, Depressed, Hopeless 0 0 0  PHQ - 2 Score 0 0 0  Altered sleeping 0 - 1  Tired, decreased energy 0 - 0  Change in appetite 0 - 0  Feeling bad or failure about yourself  0 - 0  Trouble concentrating 0 - 0  Moving slowly or fidgety/restless 0 - 0  Suicidal thoughts 0 - 0  PHQ-9 Score 0 - 1    Relevant past medical, surgical, family and social history reviewed and updated as indicated. Interim medical history since our last visit reviewed. Allergies and medications reviewed and updated.  Review of Systems  Per HPI unless specifically indicated above     Objective:    BP 108/74   Pulse 76   Temp 98.6 F (37 C) (Oral)   Wt 171 lb 11.2 oz (77.9 kg)   SpO2 97%   BMI 23.95 kg/m   Wt Readings from Last 3 Encounters:  06/29/18 171 lb 11.2 oz (77.9 kg)  06/27/18 173 lb 3.2 oz (78.6 kg)  12/28/17 170 lb 11.2 oz (77.4 kg)    Physical Exam  Constitutional: She is oriented to person, place, and time. She appears well-developed and well-nourished. No distress.  HENT:  Head: Atraumatic.  Eyes: Conjunctivae and EOM are normal.  Neck: Normal range of motion. Neck supple.  Cardiovascular: Normal rate and regular rhythm.  Pulmonary/Chest: Effort normal and breath  sounds normal.  Musculoskeletal: Normal range of motion.  Neurological: She is alert and oriented to person, place, and time.  Skin: Skin is warm and dry.  Psychiatric: She has a normal mood and affect. Her behavior is normal.  Nursing note and vitals reviewed.   Results for orders placed or performed in visit on 11/17/17  CBC with Differential/Platelet  Result Value Ref Range   WBC 6.4 3.4 - 10.8 x10E3/uL   RBC 4.46 3.77 - 5.28 x10E6/uL   Hemoglobin 13.5 11.1 - 15.9 g/dL   Hematocrit 40.1 34.0 - 46.6 %   MCV 90 79 - 97 fL   MCH 30.3 26.6 - 33.0 pg   MCHC 33.7 31.5 - 35.7 g/dL   RDW 14.0 12.3 - 15.4 %   Platelets 378 150 - 379 x10E3/uL   Neutrophils 49 Not Estab. %   Lymphs 40 Not Estab. %   Monocytes 8 Not Estab. %   Eos 2 Not Estab. %   Basos 1 Not Estab. %   Neutrophils Absolute 3.1 1.4 - 7.0 x10E3/uL   Lymphocytes Absolute 2.5 0.7 - 3.1 x10E3/uL   Monocytes Absolute 0.5 0.1 - 0.9 x10E3/uL   EOS (ABSOLUTE) 0.2 0.0 - 0.4  x10E3/uL   Basophils Absolute 0.0 0.0 - 0.2 x10E3/uL   Immature Granulocytes 0 Not Estab. %   Immature Grans (Abs) 0.0 0.0 - 0.1 x10E3/uL  TSH  Result Value Ref Range   TSH 1.280 0.450 - 4.500 uIU/mL  Vitamin B12  Result Value Ref Range   Vitamin B-12 >2000 (H) 232 - 1245 pg/mL  Vitamin D (25 hydroxy)  Result Value Ref Range   Vit D, 25-Hydroxy 34.4 30.0 - 100.0 ng/mL      Assessment & Plan:   Problem List Items Addressed This Visit      Other   Insomnia    Improved on prn belsomra. Continue regular exercise, sleep hygiene strategies      Depression - Primary    Will increase to 20 mg lexapro. Continue to monitor. Continue regular counseling sessions      Relevant Medications   escitalopram (LEXAPRO) 20 MG tablet   Anxiety    Stable on lexapro, continue current regimen      Relevant Medications   escitalopram (LEXAPRO) 20 MG tablet       Follow up plan: Return in about 6 months (around 12/29/2018) for CPE, mood f/u.

## 2018-07-02 NOTE — Assessment & Plan Note (Signed)
Stable on lexapro, continue current regimen

## 2018-07-02 NOTE — Assessment & Plan Note (Signed)
Improved on prn belsomra. Continue regular exercise, sleep hygiene strategies

## 2018-07-02 NOTE — Assessment & Plan Note (Signed)
Will increase to 20 mg lexapro. Continue to monitor. Continue regular counseling sessions

## 2018-07-02 NOTE — Patient Instructions (Signed)
Follow up in 6 months 

## 2018-09-21 ENCOUNTER — Other Ambulatory Visit: Payer: Self-pay

## 2018-09-21 MED ORDER — SUVOREXANT 15 MG PO TABS: 15.0000 mg | ORAL_TABLET | Freq: Every evening | ORAL | 1 refills | Status: DC | PRN

## 2018-09-21 NOTE — Telephone Encounter (Signed)
Patient last seen 06/29/18 and has appointment 12/28/18.

## 2018-09-23 ENCOUNTER — Other Ambulatory Visit: Payer: Self-pay | Admitting: Family Medicine

## 2018-09-24 NOTE — Telephone Encounter (Signed)
Attempted to contact patient regarding the refill for Belsomra. It looks like it went to one pharmacy but now there is a request for a different pharmacy. Left message to call the office back.

## 2018-12-04 ENCOUNTER — Other Ambulatory Visit: Payer: Self-pay

## 2018-12-04 NOTE — Telephone Encounter (Signed)
Patient last seen 06/29/18 and has appointment 12/28/18.

## 2018-12-05 ENCOUNTER — Other Ambulatory Visit: Payer: Self-pay

## 2018-12-05 ENCOUNTER — Telehealth: Payer: Self-pay

## 2018-12-05 MED ORDER — SUVOREXANT 15 MG PO TABS: 15.0000 mg | ORAL_TABLET | Freq: Every evening | ORAL | 0 refills | Status: DC | PRN

## 2018-12-05 NOTE — Telephone Encounter (Signed)
Prior Authorization initiated via CoverMyMeds for Belsomra. Key: [id number]

## 2018-12-28 ENCOUNTER — Encounter: Payer: Managed Care, Other (non HMO) | Admitting: Family Medicine

## 2019-02-01 ENCOUNTER — Encounter: Payer: Managed Care, Other (non HMO) | Admitting: Family Medicine

## 2019-02-19 NOTE — Telephone Encounter (Signed)
PA approved.

## 2019-03-06 ENCOUNTER — Encounter: Payer: Managed Care, Other (non HMO) | Admitting: Family Medicine

## 2019-03-06 ENCOUNTER — Other Ambulatory Visit: Payer: Self-pay | Admitting: Family Medicine

## 2019-03-06 NOTE — Telephone Encounter (Signed)
Forwarding medication refill to PCP for review. 

## 2019-04-10 ENCOUNTER — Encounter: Payer: Self-pay | Admitting: Family Medicine

## 2019-04-10 ENCOUNTER — Ambulatory Visit (INDEPENDENT_AMBULATORY_CARE_PROVIDER_SITE_OTHER): Payer: Managed Care, Other (non HMO) | Admitting: Family Medicine

## 2019-04-10 ENCOUNTER — Other Ambulatory Visit: Payer: Self-pay

## 2019-04-10 VITALS — BP 103/67 | HR 77 | Temp 98.6°F | Ht 70.0 in | Wt 172.8 lb

## 2019-04-10 DIAGNOSIS — F419 Anxiety disorder, unspecified: Secondary | ICD-10-CM | POA: Diagnosis not present

## 2019-04-10 DIAGNOSIS — Z1212 Encounter for screening for malignant neoplasm of rectum: Secondary | ICD-10-CM

## 2019-04-10 DIAGNOSIS — G4700 Insomnia, unspecified: Secondary | ICD-10-CM | POA: Diagnosis not present

## 2019-04-10 DIAGNOSIS — Z1211 Encounter for screening for malignant neoplasm of colon: Secondary | ICD-10-CM | POA: Diagnosis not present

## 2019-04-10 DIAGNOSIS — Z0000 Encounter for general adult medical examination without abnormal findings: Secondary | ICD-10-CM | POA: Diagnosis not present

## 2019-04-10 DIAGNOSIS — F331 Major depressive disorder, recurrent, moderate: Secondary | ICD-10-CM | POA: Diagnosis not present

## 2019-04-10 LAB — UA/M W/RFLX CULTURE, ROUTINE
Bilirubin, UA: NEGATIVE
Glucose, UA: NEGATIVE
Ketones, UA: NEGATIVE
Leukocytes,UA: NEGATIVE
Nitrite, UA: NEGATIVE
Protein,UA: NEGATIVE
RBC, UA: NEGATIVE
Specific Gravity, UA: <1.005 — ABNORMAL LOW (ref 1.005–1.030)
Urobilinogen, Ur: 0.2 mg/dL (ref 0.2–1.0)
pH, UA: 6 (ref 5.0–7.5)

## 2019-04-10 NOTE — Progress Notes (Signed)
BP 103/67 (BP Location: Left Arm, Patient Position: Sitting, Cuff Size: Normal)   Pulse 77   Temp 98.6 F (37 C) (Oral)   Ht 5\' 10"  (1.778 m)   Wt 172 lb 12.8 oz (78.4 kg)   SpO2 97%   BMI 24.79 kg/m    Subjective:    Patient ID: Virginia Austin, female    DOB: 1965/11/24, 53 y.o.   MRN: [id number]  HPI: Virginia Austin is a 53 y.o. female presenting on 04/10/2019 for comprehensive medical examination. Current medical complaints include:see below  Belsomra was making her feel groggy in the mornings, has it for as needed use but does not use it often. Moods stable on the lexapro. Denies SI/HI, mood swings.   She currently lives with: Menopausal Symptoms: on HRT  Depression Screen done today and results listed below:  Depression screen [facility] 2/9 04/10/2019 06/29/2018 06/27/2018 12/28/2017 09/29/2017  Decreased Interest 0 0 0 0 0  Down, Depressed, Hopeless 0 0 0 0 0  PHQ - 2 Score 0 0 0 0 0  Altered sleeping 0 0 - 1 0  Tired, decreased energy 0 0 - 0 0  Change in appetite 0 0 - 0 0  Feeling bad or failure about yourself  0 0 - 0 0  Trouble concentrating 0 0 - 0 0  Moving slowly or fidgety/restless 0 0 - 0 0  Suicidal thoughts 0 0 - 0 0  PHQ-9 Score 0 0 - 1 0    The patient does not have a history of falls. I did not complete a risk assessment for falls. A plan of care for falls was not documented.   Past Medical History:  Past Medical History:  Diagnosis Date  . Anxiety   . Chronic headaches    otc med prn  . Depression   . Dysthymic disorder   . Hyperlipidemia    diet controlled, no meds  . Impaired fasting glucose   . Lumbago   . Neck sprain and strain    Hx  . Vitamin D deficiency    Hx - resolved per patient    Surgical History:  Past Surgical History:  Procedure Laterality Date  . DILATATION & CURETTAGE/HYSTEROSCOPY WITH MYOSURE N/A 11/19/2015   Procedure: DILATATION & CURETTAGE/HYSTEROSCOPY WITH MYOSURE;  Surgeon: Brien Few, MD;  Location: [facility];  Service:  Gynecology;  Laterality: N/A;  . HERNIA REPAIR    . TONSILLECTOMY      Medications:  Current Outpatient Medications on File Prior to Visit  Medication Sig  . escitalopram (LEXAPRO) 20 MG tablet TAKE 1 TABLET(20 MG TOTAL) BY MOUTH DAILY.  Marland Kitchen estradiol (ESTRACE) 2 MG tablet Take 2 mg by mouth daily.  . Multiple Vitamin (MULTIVITAMIN WITH MINERALS) TABS tablet Take 1 tablet by mouth daily.  . progesterone (PROMETRIUM) 100 MG capsule   . Suvorexant (BELSOMRA) 15 MG TABS Take 15 mg by mouth at bedtime as needed. (Patient not taking: Reported on 04/10/2019)   No current facility-administered medications on file prior to visit.     Allergies:  Allergies  Allergen Reactions  . Amoxicillin Other (See Comments)    Yeast infection    Social History:  Social History   Socioeconomic History  . Marital status: Married    Spouse name: Not on file  . Number of children: Not on file  . Years of education: Not on file  . Highest education level: Not on file  Occupational History  . Not on file  Social Needs  . Financial resource strain: Not on file  . Food insecurity    Worry: Not on file    Inability: Not on file  . Transportation needs    Medical: Not on file    Non-medical: Not on file  Tobacco Use  . Smoking status: Never Smoker  . Smokeless tobacco: Never Used  Substance and Sexual Activity  . Alcohol use: Yes    Alcohol/week: 1.0 standard drinks    Types: 1 Cans of beer per week    Comment: occasional beer  . Drug use: No  . Sexual activity: Yes    Birth control/protection: Post-menopausal  Lifestyle  . Physical activity    Days per week: Not on file    Minutes per session: Not on file  . Stress: Not on file  Relationships  . Social Herbalist on phone: Not on file    Gets together: Not on file    Attends religious service: Not on file    Active member of club or organization: Not on file    Attends meetings of clubs or organizations: Not on file     Relationship status: Not on file  . Intimate partner violence    Fear of current or ex partner: Not on file    Emotionally abused: Not on file    Physically abused: Not on file    Forced sexual activity: Not on file  Other Topics Concern  . Not on file  Social History Narrative  . Not on file   Social History   Tobacco Use  Smoking Status Never Smoker  Smokeless Tobacco Never Used   Social History   Substance and Sexual Activity  Alcohol Use Yes  . Alcohol/week: 1.0 standard drinks  . Types: 1 Cans of beer per week   Comment: occasional beer    Family History:  Family History  Problem Relation Age of Onset  . Diabetes Father   . Hypertension Father   . Hypertension Sister   . Diabetes Paternal Grandmother   . Hypertension Paternal Grandmother   . Thyroid disease Paternal Grandmother     Past medical history, surgical history, medications, allergies, family history and social history reviewed with patient today and changes made to appropriate areas of the chart.   Review of Systems - General ROS: negative Psychological ROS: negative Ophthalmic ROS: negative ENT ROS: negative Allergy and Immunology ROS: negative Hematological and Lymphatic ROS: negative Endocrine ROS: negative Breast ROS: negative for breast lumps Respiratory ROS: no cough, shortness of breath, or wheezing Cardiovascular ROS: no chest pain or dyspnea on exertion Gastrointestinal ROS: no abdominal pain, change in bowel habits, or black or bloody stools Genito-Urinary ROS: no dysuria, trouble voiding, or hematuria Musculoskeletal ROS: negative Neurological ROS: no TIA or stroke symptoms Dermatological ROS: negative All other ROS negative except what is listed above and in the HPI.      Objective:    BP 103/67 (BP Location: Left Arm, Patient Position: Sitting, Cuff Size: Normal)   Pulse 77   Temp 98.6 F (37 C) (Oral)   Ht 5\' 10"  (1.778 m)   Wt 172 lb 12.8 oz (78.4 kg)   SpO2 97%   BMI  24.79 kg/m   Wt Readings from Last 3 Encounters:  04/10/19 172 lb 12.8 oz (78.4 kg)  06/29/18 171 lb 11.2 oz (77.9 kg)  06/27/18 173 lb 3.2 oz (78.6 kg)    Physical Exam Vitals signs and nursing note reviewed.  Constitutional:  General: She is not in acute distress.    Appearance: She is well-developed.  HENT:     Head: Atraumatic.     Right Ear: External ear normal.     Left Ear: External ear normal.     Nose: Nose normal.     Mouth/Throat:     Pharynx: No oropharyngeal exudate.  Eyes:     General: No scleral icterus.    Conjunctiva/sclera: Conjunctivae normal.     Pupils: Pupils are equal, round, and reactive to light.  Neck:     Musculoskeletal: Normal range of motion and neck supple.     Thyroid: No thyromegaly.  Cardiovascular:     Rate and Rhythm: Normal rate and regular rhythm.     Heart sounds: Normal heart sounds.  Pulmonary:     Effort: Pulmonary effort is normal. No respiratory distress.     Breath sounds: Normal breath sounds.     Comments: Breast exam done through GYN Abdominal:     General: Bowel sounds are normal.     Palpations: Abdomen is soft. There is no mass.     Tenderness: There is no abdominal tenderness.  Genitourinary:    Comments: Deferred today, done through GYN Musculoskeletal: Normal range of motion.        General: No tenderness.  Lymphadenopathy:     Cervical: No cervical adenopathy.  Skin:    General: Skin is warm and dry.     Findings: No rash.  Neurological:     Mental Status: She is alert and oriented to person, place, and time.     Cranial Nerves: No cranial nerve deficit.  Psychiatric:        Behavior: Behavior normal.     Results for orders placed or performed in visit on 04/10/19  CBC with Differential/Platelet  Result Value Ref Range   WBC 7.9 3.4 - 10.8 x10E3/uL   RBC 4.33 3.77 - 5.28 x10E6/uL   Hemoglobin 13.5 11.1 - 15.9 g/dL   Hematocrit 40.0 34.0 - 46.6 %   MCV 92 79 - 97 fL   MCH 31.2 26.6 - 33.0 pg    MCHC 33.8 31.5 - 35.7 g/dL   RDW 12.6 11.7 - 15.4 %   Platelets 393 150 - 450 x10E3/uL   Neutrophils 46 Not Estab. %   Lymphs 40 Not Estab. %   Monocytes 11 Not Estab. %   Eos 1 Not Estab. %   Basos 1 Not Estab. %   Neutrophils Absolute 3.7 1.4 - 7.0 x10E3/uL   Lymphocytes Absolute 3.2 (H) 0.7 - 3.1 x10E3/uL   Monocytes Absolute 0.8 0.1 - 0.9 x10E3/uL   EOS (ABSOLUTE) 0.1 0.0 - 0.4 x10E3/uL   Basophils Absolute 0.1 0.0 - 0.2 x10E3/uL   Immature Granulocytes 1 Not Estab. %   Immature Grans (Abs) 0.1 0.0 - 0.1 x10E3/uL  Comprehensive metabolic panel  Result Value Ref Range   Glucose 88 65 - 99 mg/dL   BUN 12 6 - 24 mg/dL   Creatinine, Ser 0.72 0.57 - 1.00 mg/dL   GFR calc non Af Amer 95 >59 mL/min/1.73   GFR calc Af Amer 110 >59 mL/min/1.73   BUN/Creatinine Ratio 17 9 - 23   Sodium 138 134 - 144 mmol/L   Potassium 4.2 3.5 - 5.2 mmol/L   Chloride 99 96 - 106 mmol/L   CO2 24 20 - 29 mmol/L   Calcium 9.7 8.7 - 10.2 mg/dL   Total Protein 7.1 6.0 - 8.5 g/dL   Albumin  4.5 3.8 - 4.9 g/dL   Globulin, Total 2.6 1.5 - 4.5 g/dL   Albumin/Globulin Ratio 1.7 1.2 - 2.2   Bilirubin Total 0.3 0.0 - 1.2 mg/dL   Alkaline Phosphatase 81 39 - 117 IU/L   AST 15 0 - 40 IU/L   ALT 15 0 - 32 IU/L  Lipid Panel w/o Chol/HDL Ratio  Result Value Ref Range   Cholesterol, Total 324 (H) 100 - 199 mg/dL   Triglycerides 483 (H) 0 - 149 mg/dL   HDL 46 >39 mg/dL   VLDL Cholesterol Cal Comment 5 - 40 mg/dL   LDL Calculated Comment 0 - 99 mg/dL  UA/M w/rflx Culture, Routine   Specimen: Urine   URINE  Result Value Ref Range   Specific Gravity, UA <1.005 (L) 1.005 - 1.030   pH, UA 6.0 5.0 - 7.5   Color, UA Yellow Yellow   Appearance Ur Clear Clear   Leukocytes,UA Negative Negative   Protein,UA Negative Negative/Trace   Glucose, UA Negative Negative   Ketones, UA Negative Negative   RBC, UA Negative Negative   Bilirubin, UA Negative Negative   Urobilinogen, Ur 0.2 0.2 - 1.0 mg/dL   Nitrite, UA  Negative Negative      Assessment & Plan:   Problem List Items Addressed This Visit      Other   Insomnia - Primary    Stable, rarely taking belsomra but wants to keep active script so she can take prn. Continue current regimen      Depression    Chronic, stable, controlled well with lexapro. Continue current regimen      Anxiety    Chronic, stable, controlled well with lexapro. Continue current regimen       Other Visit Diagnoses    Annual physical exam       Relevant Orders   CBC with Differential/Platelet (Completed)   Comprehensive metabolic panel (Completed)   Lipid Panel w/o Chol/HDL Ratio (Completed)   UA/M w/rflx Culture, Routine (Completed)   Encounter for colorectal cancer screening       Relevant Orders   Ambulatory referral to Gastroenterology       Follow up plan: Return in about 6 months (around 10/11/2019) for 6 month f/u.   LABORATORY TESTING:  - Pap smear: up to date  IMMUNIZATIONS:   - Tdap: Tetanus vaccination status reviewed: last tetanus booster within 10 years. - Influenza: Postponed to flu season  SCREENING: -Mammogram: Up to date  - Colonoscopy: Ordered today   PATIENT COUNSELING:   Advised to take 1 mg of folate supplement per day if capable of pregnancy.   Sexuality: Discussed sexually transmitted diseases, partner selection, use of condoms, avoidance of unintended pregnancy  and contraceptive alternatives.   Advised to avoid cigarette smoking.  I discussed with the patient that most people either abstain from alcohol or drink within safe limits (<=14/week and <=4 drinks/occasion for males, <=7/weeks and <= 3 drinks/occasion for females) and that the risk for alcohol disorders and other health effects rises proportionally with the number of drinks per week and how often a drinker exceeds daily limits.  Discussed cessation/primary prevention of drug use and availability of treatment for abuse.   Diet: Encouraged to adjust caloric  intake to maintain  or achieve ideal body weight, to reduce intake of dietary saturated fat and total fat, to limit sodium intake by avoiding high sodium foods and not adding table salt, and to maintain adequate dietary potassium and calcium preferably from fresh fruits, vegetables,  and low-fat dairy products.    stressed the importance of regular exercise  Injury prevention: Discussed safety belts, safety helmets, smoke detector, smoking near bedding or upholstery.   Dental health: Discussed importance of regular tooth brushing, flossing, and dental visits.    NEXT PREVENTATIVE PHYSICAL DUE IN 1 YEAR. Return in about 6 months (around 10/11/2019) for 6 month f/u.

## 2019-04-11 ENCOUNTER — Other Ambulatory Visit: Payer: Self-pay | Admitting: Family Medicine

## 2019-04-11 DIAGNOSIS — E782 Mixed hyperlipidemia: Secondary | ICD-10-CM

## 2019-04-11 LAB — LIPID PANEL W/O CHOL/HDL RATIO
Cholesterol, Total: 324 mg/dL — ABNORMAL HIGH (ref 100–199)
HDL: 46 mg/dL (ref >39)
Triglycerides: 483 mg/dL — ABNORMAL HIGH (ref 0–149)

## 2019-04-11 LAB — CBC WITH DIFFERENTIAL/PLATELET
Basophils Absolute: 0.1 10*3/uL (ref 0.0–0.2)
Basos: 1 %
EOS (ABSOLUTE): 0.1 10*3/uL (ref 0.0–0.4)
Eos: 1 %
Hematocrit: 40 % (ref 34.0–46.6)
Hemoglobin: 13.5 g/dL (ref 11.1–15.9)
Immature Grans (Abs): 0.1 10*3/uL (ref 0.0–0.1)
Immature Granulocytes: 1 %
Lymphocytes Absolute: 3.2 10*3/uL — ABNORMAL HIGH (ref 0.7–3.1)
Lymphs: 40 %
MCH: 31.2 pg (ref 26.6–33.0)
MCHC: 33.8 g/dL (ref 31.5–35.7)
MCV: 92 fL (ref 79–97)
Monocytes Absolute: 0.8 10*3/uL (ref 0.1–0.9)
Monocytes: 11 %
Neutrophils Absolute: 3.7 10*3/uL (ref 1.4–7.0)
Neutrophils: 46 %
Platelets: 393 10*3/uL (ref 150–450)
RBC: 4.33 x10E6/uL (ref 3.77–5.28)
RDW: 12.6 % (ref 11.7–15.4)
WBC: 7.9 10*3/uL (ref 3.4–10.8)

## 2019-04-11 LAB — COMPREHENSIVE METABOLIC PANEL
ALT: 15 IU/L (ref 0–32)
AST: 15 IU/L (ref 0–40)
Albumin/Globulin Ratio: 1.7 (ref 1.2–2.2)
Albumin: 4.5 g/dL (ref 3.8–4.9)
Alkaline Phosphatase: 81 IU/L (ref 39–117)
BUN/Creatinine Ratio: 17 (ref 9–23)
BUN: 12 mg/dL (ref 6–24)
Bilirubin Total: 0.3 mg/dL (ref 0.0–1.2)
CO2: 24 mmol/L (ref 20–29)
Calcium: 9.7 mg/dL (ref 8.7–10.2)
Chloride: 99 mmol/L (ref 96–106)
Creatinine, Ser: 0.72 mg/dL (ref 0.57–1.00)
GFR calc Af Amer: 110 mL/min/{1.73_m2} (ref >59)
GFR calc non Af Amer: 95 mL/min/{1.73_m2} (ref >59)
Globulin, Total: 2.6 g/dL (ref 1.5–4.5)
Glucose: 88 mg/dL (ref 65–99)
Potassium: 4.2 mmol/L (ref 3.5–5.2)
Sodium: 138 mmol/L (ref 134–144)
Total Protein: 7.1 g/dL (ref 6.0–8.5)

## 2019-04-11 NOTE — Assessment & Plan Note (Signed)
Stable, rarely taking belsomra but wants to keep active script so she can take prn. Continue current regimen

## 2019-04-11 NOTE — Assessment & Plan Note (Signed)
Chronic, stable, controlled well with lexapro. Continue current regimen

## 2019-04-24 ENCOUNTER — Encounter: Payer: Self-pay | Admitting: *Deleted

## 2019-05-14 ENCOUNTER — Encounter: Payer: Self-pay | Admitting: Family Medicine

## 2019-05-14 NOTE — Telephone Encounter (Signed)
OK to call and give results and letter if desired

## 2019-05-16 ENCOUNTER — Other Ambulatory Visit: Payer: Self-pay

## 2019-05-16 ENCOUNTER — Other Ambulatory Visit: Payer: Managed Care, Other (non HMO)

## 2019-05-16 DIAGNOSIS — E782 Mixed hyperlipidemia: Secondary | ICD-10-CM

## 2019-05-17 ENCOUNTER — Other Ambulatory Visit: Payer: Self-pay | Admitting: Family Medicine

## 2019-05-17 LAB — LIPID PANEL W/O CHOL/HDL RATIO
Cholesterol, Total: 277 mg/dL — ABNORMAL HIGH (ref 100–199)
HDL: 51 mg/dL (ref >39)
LDL Chol Calc (NIH): 188 mg/dL — ABNORMAL HIGH (ref 0–99)
Triglycerides: 200 mg/dL — ABNORMAL HIGH (ref 0–149)
VLDL Cholesterol Cal: 38 mg/dL (ref 5–40)

## 2019-05-17 MED ORDER — ATORVASTATIN CALCIUM 40 MG PO TABS: 40.0000 mg | ORAL_TABLET | Freq: Every day | ORAL | 1 refills | Status: DC

## 2019-05-22 ENCOUNTER — Encounter: Payer: Self-pay | Admitting: Family Medicine

## 2019-05-23 ENCOUNTER — Other Ambulatory Visit: Payer: Self-pay | Admitting: Family Medicine

## 2019-05-23 MED ORDER — BELSOMRA 15 MG PO TABS: 15.0000 mg | ORAL_TABLET | Freq: Every evening | ORAL | 0 refills | Status: DC | PRN

## 2019-05-31 ENCOUNTER — Other Ambulatory Visit: Payer: Self-pay

## 2019-05-31 DIAGNOSIS — Z1211 Encounter for screening for malignant neoplasm of colon: Secondary | ICD-10-CM

## 2019-05-31 MED ORDER — NA SULFATE-K SULFATE-MG SULF 17.5-3.13-1.6 GM/177ML PO SOLN: 1.0000 | Freq: Once | ORAL | 0 refills | Status: AC

## 2019-06-03 ENCOUNTER — Other Ambulatory Visit
Admission: RE | Admit: 2019-06-03 | Discharge: 2019-06-03 | Disposition: A | Discharge status: Home / Self Care | Payer: Managed Care, Other (non HMO) | Source: Ambulatory Visit | Attending: Gastroenterology | Admitting: Gastroenterology

## 2019-06-03 ENCOUNTER — Other Ambulatory Visit: Payer: Self-pay

## 2019-06-03 ENCOUNTER — Encounter: Payer: Self-pay | Admitting: *Deleted

## 2019-06-03 DIAGNOSIS — Z01812 Encounter for preprocedural laboratory examination: Secondary | ICD-10-CM | POA: Insufficient documentation

## 2019-06-03 DIAGNOSIS — K635 Polyp of colon: Secondary | ICD-10-CM | POA: Diagnosis not present

## 2019-06-03 DIAGNOSIS — Z20828 Contact with and (suspected) exposure to other viral communicable diseases: Secondary | ICD-10-CM | POA: Insufficient documentation

## 2019-06-04 LAB — SARS CORONAVIRUS 2 (TAT 6-24 HRS): SARS Coronavirus 2: NEGATIVE

## 2019-06-05 NOTE — Discharge Instructions (Signed)

## 2019-06-06 ENCOUNTER — Ambulatory Visit
Admission: RE | Admit: 2019-06-06 | Discharge: 2019-06-06 | Disposition: A | Discharge status: Home / Self Care | Payer: Managed Care, Other (non HMO) | Attending: Gastroenterology | Admitting: Gastroenterology

## 2019-06-06 ENCOUNTER — Ambulatory Visit: Payer: Managed Care, Other (non HMO) | Admitting: Anesthesiology

## 2019-06-06 ENCOUNTER — Encounter
Admission: RE | Disposition: A | Discharge status: Home / Self Care | Payer: Self-pay | Source: Home / Self Care | Attending: Gastroenterology

## 2019-06-06 DIAGNOSIS — R7301 Impaired fasting glucose: Secondary | ICD-10-CM | POA: Insufficient documentation

## 2019-06-06 DIAGNOSIS — Z833 Family history of diabetes mellitus: Secondary | ICD-10-CM | POA: Diagnosis not present

## 2019-06-06 DIAGNOSIS — E785 Hyperlipidemia, unspecified: Secondary | ICD-10-CM | POA: Diagnosis not present

## 2019-06-06 DIAGNOSIS — K635 Polyp of colon: Secondary | ICD-10-CM | POA: Diagnosis not present

## 2019-06-06 DIAGNOSIS — Z79899 Other long term (current) drug therapy: Secondary | ICD-10-CM | POA: Diagnosis not present

## 2019-06-06 DIAGNOSIS — F341 Dysthymic disorder: Secondary | ICD-10-CM | POA: Diagnosis not present

## 2019-06-06 DIAGNOSIS — F419 Anxiety disorder, unspecified: Secondary | ICD-10-CM | POA: Diagnosis not present

## 2019-06-06 DIAGNOSIS — K640 First degree hemorrhoids: Secondary | ICD-10-CM | POA: Diagnosis not present

## 2019-06-06 DIAGNOSIS — M545 Low back pain: Secondary | ICD-10-CM | POA: Diagnosis not present

## 2019-06-06 DIAGNOSIS — Z8349 Family history of other endocrine, nutritional and metabolic diseases: Secondary | ICD-10-CM | POA: Diagnosis not present

## 2019-06-06 DIAGNOSIS — Z1211 Encounter for screening for malignant neoplasm of colon: Secondary | ICD-10-CM

## 2019-06-06 DIAGNOSIS — Z9109 Other allergy status, other than to drugs and biological substances: Secondary | ICD-10-CM | POA: Insufficient documentation

## 2019-06-06 DIAGNOSIS — R51 Headache: Secondary | ICD-10-CM | POA: Diagnosis not present

## 2019-06-06 DIAGNOSIS — Z8249 Family history of ischemic heart disease and other diseases of the circulatory system: Secondary | ICD-10-CM | POA: Insufficient documentation

## 2019-06-06 DIAGNOSIS — F329 Major depressive disorder, single episode, unspecified: Secondary | ICD-10-CM | POA: Insufficient documentation

## 2019-06-06 DIAGNOSIS — M170 Bilateral primary osteoarthritis of knee: Secondary | ICD-10-CM | POA: Insufficient documentation

## 2019-06-06 DIAGNOSIS — D125 Benign neoplasm of sigmoid colon: Secondary | ICD-10-CM

## 2019-06-06 HISTORY — DX: Unspecified osteoarthritis, unspecified site: M19.90

## 2019-06-06 HISTORY — DX: Presence of spectacles and contact lenses: Z97.3

## 2019-06-06 HISTORY — PX: POLYPECTOMY: SHX5525

## 2019-06-06 HISTORY — PX: COLONOSCOPY WITH PROPOFOL: SHX5780

## 2019-06-06 SURGERY — COLONOSCOPY WITH PROPOFOL
Anesthesia: General | Site: Rectum

## 2019-06-06 MED ORDER — LACTATED RINGERS IV SOLN
INTRAVENOUS | Status: DC
  Administered 2019-06-06: 11:00:00 via INTRAVENOUS

## 2019-06-06 MED ORDER — ACETAMINOPHEN 160 MG/5ML PO SOLN: 325.0000 mg | Freq: Once | ORAL | Status: DC

## 2019-06-06 MED ORDER — STERILE WATER FOR IRRIGATION IR SOLN
Status: DC | PRN
  Administered 2019-06-06: 12:00:00 50 mL

## 2019-06-06 MED ORDER — LIDOCAINE HCL (CARDIAC) PF 100 MG/5ML IV SOSY
PREFILLED_SYRINGE | INTRAVENOUS | Status: DC | PRN
  Administered 2019-06-06: 40 mg via INTRAVENOUS

## 2019-06-06 MED ORDER — ACETAMINOPHEN 325 MG PO TABS: 325.0000 mg | ORAL_TABLET | Freq: Once | ORAL | Status: DC

## 2019-06-06 MED ORDER — PROPOFOL 10 MG/ML IV BOLUS
INTRAVENOUS | Status: DC | PRN
  Administered 2019-06-06 (×2): 100 mg via INTRAVENOUS

## 2019-06-06 SURGICAL SUPPLY — 6 items
CANISTER SUCT 1200ML W/VALVE (MISCELLANEOUS) ×3 IMPLANT
FORCEPS BIOP RAD 4 LRG CAP 4 (CUTTING FORCEPS) ×2 IMPLANT
GOWN CVR UNV OPN BCK APRN NK (MISCELLANEOUS) ×2 IMPLANT
GOWN ISOL THUMB LOOP REG UNIV (MISCELLANEOUS) ×6
KIT ENDO PROCEDURE OLY (KITS) ×3 IMPLANT
WATER STERILE IRR 250ML POUR (IV SOLUTION) ×3 IMPLANT

## 2019-06-06 NOTE — Anesthesia Postprocedure Evaluation (Signed)
Anesthesia Post Note  Patient: Virginia Austin  Procedure(s) Performed: COLONOSCOPY WITH BIOPSY (N/A Rectum) POLYPECTOMY (N/A Rectum)  Patient location during evaluation: PACU Anesthesia Type: General Level of consciousness: awake and alert and oriented Pain management: satisfactory to patient Vital Signs Assessment: post-procedure vital signs reviewed and stable Respiratory status: spontaneous breathing, nonlabored ventilation and respiratory function stable Cardiovascular status: blood pressure returned to baseline and stable Postop Assessment: Adequate PO intake and No signs of nausea or vomiting Anesthetic complications: no    Raliegh Ip

## 2019-06-06 NOTE — Op Note (Signed)
[facility] Gastroenterology Patient Name: Virginia Austin Procedure Date: 06/06/2019 11:29 AM MRN: [id number] Account #: [id number] Date of Birth: 1965-09-17 Admit Type: Outpatient Age: 53 Room: [facility] OR ROOM 01 Gender: Female Note Status: Finalized Procedure:            Colonoscopy Indications:          Screening for colorectal malignant neoplasm Providers:            [name] MD, MD Referring MD:         Guadalupe Maple, MD (Referring MD) Medicines:            Propofol per Anesthesia Complications:        No immediate complications. Procedure:            Pre-Anesthesia Assessment:                       - Prior to the procedure, a History and Physical was                        performed, and patient medications and allergies were                        reviewed. The patient's tolerance of previous                        anesthesia was also reviewed. The risks and benefits of                        the procedure and the sedation options and risks were                        discussed with the patient. All questions were                        answered, and informed consent was obtained. Prior                        Anticoagulants: The patient has taken no previous                        anticoagulant or antiplatelet agents. ASA Grade                        Assessment: II - A patient with mild systemic disease.                        After reviewing the risks and benefits, the patient was                        deemed in satisfactory condition to undergo the                        procedure.                       After obtaining informed consent, the colonoscope was                        passed under direct vision. Throughout the procedure,  the patient's blood pressure, pulse, and oxygen                        saturations were monitored continuously. The                        Colonoscope was introduced through the anus and         advanced to the the cecum, identified by appendiceal                        orifice and ileocecal valve. The colonoscopy was                        performed without difficulty. The patient tolerated the                        procedure well. The quality of the bowel preparation                        was excellent. Findings:      The perianal and digital rectal examinations were normal.      A 3 mm polyp was found in the sigmoid colon. The polyp was sessile. The       polyp was removed with a cold biopsy forceps. Resection and retrieval       were complete.      Non-bleeding internal hemorrhoids were found during retroflexion. The       hemorrhoids were Grade I (internal hemorrhoids that do not prolapse). Impression:           - One 3 mm polyp in the sigmoid colon, removed with a                        cold biopsy forceps. Resected and retrieved.                       - Non-bleeding internal hemorrhoids. Recommendation:       - Discharge patient to home.                       - Resume previous diet.                       - Continue present medications.                       - Await pathology results.                       - Repeat colonoscopy in 5 years if polyp adenoma and 10                        years if hyperplastic Procedure Code(s):    --- Professional ---                       [phone], Colonoscopy, flexible; with biopsy, single or                        multiple Diagnosis Code(s):    --- Professional ---  Z12.11, Encounter for screening for malignant neoplasm                        of colon                       K63.5, Polyp of colon CPT copyright 2019 American Medical Association. All rights reserved. The codes documented in this report are preliminary and upon coder review may  be revised to meet current compliance requirements. [name] MD, MD 06/06/2019 11:51:40 AM This report has been signed electronically. Number of Addenda: 0 Note  Initiated On: 06/06/2019 11:29 AM Scope Withdrawal Time: 0 hours 6 minutes 54 seconds  Total Procedure Duration: 0 hours 12 minutes 54 seconds  Estimated Blood Loss: Estimated blood loss: none.      [facility]

## 2019-06-06 NOTE — Anesthesia Preprocedure Evaluation (Signed)
Anesthesia Evaluation  Patient identified by MRN, date of birth, ID band Patient awake    Reviewed: Allergy & Precautions, H&P , NPO status , Patient's Chart, lab work & pertinent test results  Airway Mallampati: II  TM Distance: >3 FB Neck ROM: full    Dental no notable dental hx.    Pulmonary    Pulmonary exam normal breath sounds clear to auscultation       Cardiovascular Normal cardiovascular exam Rhythm:regular Rate:Normal     Neuro/Psych  Headaches, PSYCHIATRIC DISORDERS    GI/Hepatic   Endo/Other    Renal/GU      Musculoskeletal   Abdominal   Peds  Hematology   Anesthesia Other Findings   Reproductive/Obstetrics                             Anesthesia Physical Anesthesia Plan  ASA: II  Anesthesia Plan: General   Post-op Pain Management:    Induction: Intravenous  PONV Risk Score and Plan: Propofol infusion and Treatment may vary due to age or medical condition  Airway Management Planned: Natural Airway  Additional Equipment:   Intra-op Plan:   Post-operative Plan:   Informed Consent: I have reviewed the patients History and Physical, chart, labs and discussed the procedure including the risks, benefits and alternatives for the proposed anesthesia with the patient or authorized representative who has indicated his/her understanding and acceptance.       Plan Discussed with: CRNA  Anesthesia Plan Comments:         Anesthesia Quick Evaluation

## 2019-06-06 NOTE — H&P (Signed)
Virginia Lame, MD [address] [place], [place] [address] Phone: [phone] Fax : [phone]  Primary Care Physician:  Volney American, [place] Primary Gastroenterologist:  Dr. Allen Norris  Pre-Procedure History & Physical: HPI:  Virginia Austin is a 53 y.o. female is here for a screening colonoscopy.   Past Medical History:  Diagnosis Date  . Anxiety   . Arthritis    knees  . Chronic headaches    otc med prn  . Depression   . Dysthymic disorder   . Hyperlipidemia    diet controlled, no meds  . Impaired fasting glucose   . Lumbago   . Neck sprain and strain    Hx  . Vitamin D deficiency    Hx - resolved per patient  . Wears contact lenses     Past Surgical History:  Procedure Laterality Date  . DILATATION & CURETTAGE/HYSTEROSCOPY WITH MYOSURE N/A 11/19/2015   Procedure: DILATATION & CURETTAGE/HYSTEROSCOPY WITH MYOSURE;  Surgeon: Brien Few, MD;  Location: [facility] [facility];  Service: Gynecology;  Laterality: N/A;  . HERNIA REPAIR    . TONSILLECTOMY      Prior to Admission medications   Medication Sig Start Date End Date Taking? Authorizing Provider  atorvastatin (LIPITOR) 40 MG tablet Take 1 tablet (40 mg total) by mouth daily. 05/17/19  Yes Volney American, PA-C  escitalopram (LEXAPRO) 20 MG tablet TAKE 1 TABLET(20 MG TOTAL) BY MOUTH DAILY. 03/07/19  Yes Volney American, PA-C  estradiol (ESTRACE) 2 MG tablet Take 2 mg by mouth daily.   Yes [provider]  Multiple Vitamin (MULTIVITAMIN WITH MINERALS) TABS tablet Take 1 tablet by mouth daily.   Yes [provider]  progesterone (PROMETRIUM) 100 MG capsule  11/09/17  Yes [provider]  Suvorexant (BELSOMRA) 15 MG TABS Take 15 mg by mouth at bedtime as needed. 05/23/19  Yes Volney American, PA-C    Allergies as of 05/31/2019 - Review Complete 04/10/2019  Allergen Reaction Noted  . Amoxicillin Other (See Comments) 02/24/2016    Family History  Problem Relation Age  of Onset  . Diabetes Father   . Hypertension Father   . Hypertension Sister   . Diabetes Paternal Grandmother   . Hypertension Paternal Grandmother   . Thyroid disease Paternal Grandmother     Social History   Socioeconomic History  . Marital status: Married    Spouse name: Not on file  . Number of children: Not on file  . Years of education: Not on file  . Highest education level: Not on file  Occupational History  . Not on file  Social Needs  . Financial resource strain: Not on file  . Food insecurity    Worry: Not on file    Inability: Not on file  . Transportation needs    Medical: Not on file    Non-medical: Not on file  Tobacco Use  . Smoking status: Never Smoker  . Smokeless tobacco: Never Used  Substance and Sexual Activity  . Alcohol use: Yes    Alcohol/week: 1.0 standard drinks    Types: 1 Cans of beer per week    Comment: occasional beer  . Drug use: No  . Sexual activity: Yes    Birth control/protection: Post-menopausal  Lifestyle  . Physical activity    Days per week: Not on file    Minutes per session: Not on file  . Stress: Not on file  Relationships  . Social connections    Talks  on phone: Not on file    Gets together: Not on file    Attends religious service: Not on file    Active member of club or organization: Not on file    Attends meetings of clubs or organizations: Not on file    Relationship status: Not on file  . Intimate partner violence    Fear of current or ex partner: Not on file    Emotionally abused: Not on file    Physically abused: Not on file    Forced sexual activity: Not on file  Other Topics Concern  . Not on file  Social History Narrative  . Not on file    Review of Systems: See HPI, otherwise negative ROS  Physical Exam: BP 103/86   Pulse 80   Temp (!) 97.5 F (36.4 C) (Temporal)   Resp 16   Ht 5\' 11"  (1.803 m)   Wt 75.8 kg   SpO2 97%   BMI 23.29 kg/m  General:   Alert,  pleasant and cooperative in NAD  Head:  Normocephalic and atraumatic. Neck:  Supple; no masses or thyromegaly. Lungs:  Clear throughout to auscultation.    Heart:  Regular rate and rhythm. Abdomen:  Soft, nontender and nondistended. Normal bowel sounds, without guarding, and without rebound.   Neurologic:  Alert and  oriented x4;  grossly normal neurologically.  Impression/Plan: Virginia Austin is now here to undergo a screening colonoscopy.  Risks, benefits, and alternatives regarding colonoscopy have been reviewed with the patient.  Questions have been answered.  All parties agreeable.

## 2019-06-06 NOTE — Transfer of Care (Signed)
Immediate Anesthesia Transfer of Care Note  Patient: Virginia Austin  Procedure(s) Performed: COLONOSCOPY WITH BIOPSY (N/A Rectum) POLYPECTOMY (N/A Rectum)  Patient Location: PACU  Anesthesia Type: General  Level of Consciousness: awake, alert  and patient cooperative  Airway and Oxygen Therapy: Patient Spontanous Breathing and Patient connected to supplemental oxygen  Post-op Assessment: Post-op Vital signs reviewed, Patient's Cardiovascular Status Stable, Respiratory Function Stable, Patent Airway and No signs of Nausea or vomiting  Post-op Vital Signs: Reviewed and stable  Complications: No apparent anesthesia complications

## 2019-06-07 ENCOUNTER — Encounter: Payer: Self-pay | Admitting: Gastroenterology

## 2019-06-10 ENCOUNTER — Encounter: Payer: Self-pay | Admitting: Gastroenterology

## 2019-06-11 ENCOUNTER — Encounter: Payer: Self-pay | Admitting: Gastroenterology

## 2019-06-19 ENCOUNTER — Other Ambulatory Visit: Payer: Self-pay | Admitting: Family Medicine

## 2019-06-19 NOTE — Telephone Encounter (Signed)
Requested medication (s) are due for refill today: yes  Requested medication (s) are on the active medication list: yes  Last refill:  05/23/2019  Future visit scheduled: yes  Notes to clinic:refill cannot be delegated    Requested Prescriptions  Pending Prescriptions Disp Refills   BELSOMRA 15 MG TABS [Pharmacy Med Name: BELSOMRA 15 MG TABLET] 30 tablet 0    Sig: Take 15 mg by mouth at bedtime as needed.     Off-Protocol Failed - 06/19/2019 11:13 AM      Failed - Medication not assigned to a protocol, review manually.      Passed - Valid encounter within last 12 months    Recent Outpatient Visits          2 months ago Insomnia, unspecified type   [place], [place]   11 months ago Moderate episode of recurrent major depressive disorder [facility])   [place], [place], [place]   11 months ago [place], [place], [place]   1 year ago Moderate episode of recurrent major depressive disorder [facility])   [facility] Volney American, [place]   1 year ago Fatigue, unspecified type   [facility], Lilia Argue, [place]      Future Appointments            In 3 months Orene Desanctis, Lilia Argue, [place], [place]

## 2019-07-31 ENCOUNTER — Encounter: Payer: Self-pay | Admitting: Family Medicine

## 2019-08-01 ENCOUNTER — Ambulatory Visit (INDEPENDENT_AMBULATORY_CARE_PROVIDER_SITE_OTHER): Payer: Managed Care, Other (non HMO) | Admitting: Family Medicine

## 2019-08-01 ENCOUNTER — Encounter: Payer: Self-pay | Admitting: Family Medicine

## 2019-08-01 ENCOUNTER — Other Ambulatory Visit: Payer: Self-pay

## 2019-08-01 DIAGNOSIS — J0100 Acute maxillary sinusitis, unspecified: Secondary | ICD-10-CM

## 2019-08-01 DIAGNOSIS — Z20822 Contact with and (suspected) exposure to covid-19: Secondary | ICD-10-CM

## 2019-08-01 DIAGNOSIS — Z20828 Contact with and (suspected) exposure to other viral communicable diseases: Secondary | ICD-10-CM | POA: Diagnosis not present

## 2019-08-01 MED ORDER — AZITHROMYCIN 250 MG PO TABS: ORAL_TABLET | ORAL | 0 refills | Status: DC

## 2019-08-01 NOTE — Telephone Encounter (Signed)
Seen today virtually  Copied from [place] [phone]. Topic: Appointment Scheduling - Scheduling Inquiry for Clinic >> Jul 31, 2019  1:34 PM Scherrie Gerlach wrote: Reason for CRM: pt sent mychart about getting a zpak.  I made pt a virtual visit for tomorrow, but if Apolonio Schneiders sends in Rx for her today, I told her we can cancel w/out penalty. >> Jul 31, 2019  2:09 PM Don Perking M wrote: Appt needed?

## 2019-08-01 NOTE — Progress Notes (Signed)
There were no vitals taken for this visit.   Subjective:    Patient ID: Virginia Austin, female    DOB: [birth year], 54 y.o.   MRN: [id number]  HPI: Virginia Austin is a 53 y.o. female  Chief Complaint  Patient presents with  . COVID    pt states her sister in law kept her kids last weekend and then she tested positive last Tuesday. Patient states she has been tested and it was negative.     . This visit was completed via WebEx due to the restrictions of the COVID-19 pandemic. All issues as above were discussed and addressed. Physical exam was done as above through visual confirmation on WebEx. If it was felt that the patient should be evaluated in the office, they were directed there. The patient verbally consented to this visit. . Location of the patient: home . Location of the provider: work . Those involved with this call:  . Provider: Merrie Roof, PA-C . CMA: Tiffany Reel, CMA . Front Desk/Registration: Jill Side  . Time spent on call: 15 minutes with patient face to face via video conference. More than 50% of this time was spent in counseling and coordination of care. 5 minutes total spent in review of patient's record and preparation of their chart. I verified patient identity using two factors (patient name and date of birth). Patient consents verbally to being seen via telemedicine visit today.   Under quarantine due to exposure with family member to [place], that should be up in about 3 days now. Has had congestion, sinus pain and headache, cough for over a week now though. Denies fever, chills, sweats, body aches, N/V/D, loss of taste or smell. Taking tylenol, vitamin C and fluids without much relief.   Relevant past medical, surgical, family and social history reviewed and updated as indicated. Interim medical history since our last visit reviewed. Allergies and medications reviewed and updated.  Review of Systems  Per HPI unless specifically indicated above      Objective:    There were no vitals taken for this visit.  Wt Readings from Last 3 Encounters:  06/06/19 167 lb (75.8 kg)  04/10/19 172 lb 12.8 oz (78.4 kg)  06/29/18 171 lb 11.2 oz (77.9 kg)    Physical Exam Vitals signs and nursing note reviewed.  Constitutional:      General: She is not in acute distress.    Appearance: Normal appearance.  HENT:     Head: Atraumatic.     Right Ear: External ear normal.     Left Ear: External ear normal.     Nose: Congestion present.     Mouth/Throat:     Mouth: Mucous membranes are moist.     Pharynx: Oropharynx is clear. Posterior oropharyngeal erythema present.  Eyes:     Extraocular Movements: Extraocular movements intact.     Conjunctiva/sclera: Conjunctivae normal.  Neck:     Musculoskeletal: Normal range of motion.  Cardiovascular:     Comments: Unable to assess via virtual visit Pulmonary:     Effort: Pulmonary effort is normal. No respiratory distress.  Musculoskeletal: Normal range of motion.  Skin:    General: Skin is dry.     Findings: No erythema.  Neurological:     Mental Status: She is alert and oriented to person, place, and time.  Psychiatric:        Mood and Affect: Mood normal.        Thought Content: Thought content normal.  Judgment: Judgment normal.     Results for orders placed or performed during the hospital encounter of 06/03/19  SARS CORONAVIRUS 2 (TAT 6-24 HRS) Nasopharyngeal Nasopharyngeal Swab   Specimen: Nasopharyngeal Swab  Result Value Ref Range   SARS Coronavirus 2 NEGATIVE NEGATIVE      Assessment & Plan:   Problem List Items Addressed This Visit    None    Visit Diagnoses    Suspected COVID-19 virus infection    -  Primary   Declines testing as her quarantine is almost up, continue quarantine, watch for resolution of sxs with zpak and supportive care discussed   Acute maxillary sinusitis, recurrence not specified       Tx with zpak, mucinex, sinus rinses, fluids. F/u if not  improving   Relevant Medications   azithromycin (ZITHROMAX) 250 MG tablet       Follow up plan: Return if symptoms worsen or fail to improve.

## 2019-08-03 ENCOUNTER — Other Ambulatory Visit: Payer: Self-pay

## 2019-08-03 DIAGNOSIS — Z20822 Contact with and (suspected) exposure to covid-19: Secondary | ICD-10-CM

## 2019-08-05 LAB — NOVEL CORONAVIRUS, NAA: SARS-CoV-2, NAA: NOT DETECTED

## 2019-10-01 ENCOUNTER — Other Ambulatory Visit: Payer: Self-pay | Admitting: Family Medicine

## 2019-10-01 NOTE — Telephone Encounter (Signed)
Requested Prescriptions  Pending Prescriptions Disp Refills  . atorvastatin (LIPITOR) 40 MG tablet [Pharmacy Med Name: ATORVASTATIN 40 MG TABLET] 30 tablet 0    Sig: Take 1 tablet (40 mg total) by mouth daily.     Cardiovascular:  Antilipid - Statins Failed - 10/01/2019  1:14 PM      Failed - Total Cholesterol in normal range and within 360 days    Cholesterol, Total  Date Value Ref Range Status  05/16/2019 277 (H) 100 - 199 mg/dL Final         Failed - Triglycerides in normal range and within 360 days    Triglycerides  Date Value Ref Range Status  05/16/2019 200 (H) 0 - 149 mg/dL Final         Passed - LDL in normal range and within 360 days    LDL Chol Calc (NIH)  Date Value Ref Range Status  05/16/2019 188 (H) 0 - 99 mg/dL Final         Passed - HDL in normal range and within 360 days    HDL  Date Value Ref Range Status  05/16/2019 51 >39 mg/dL Final         Passed - Patient is not pregnant      Passed - Valid encounter within last 12 months    Recent Outpatient Visits          2 months ago Suspected COVID-19 virus infection   [place], [place], [place]   5 months ago Insomnia, unspecified type   [facility], [place], [place]   1 year ago Moderate episode of recurrent major depressive disorder [facility])   [facility] Volney American, [place]   1 year ago [place], [place], [place]   1 year ago Moderate episode of recurrent major depressive disorder [facility])   [place], [place], [place]      Future Appointments            In 1 week Orene Desanctis, Lilia Argue, PA-C [facility], [facility]

## 2019-10-11 ENCOUNTER — Ambulatory Visit: Payer: Managed Care, Other (non HMO) | Admitting: Family Medicine

## 2019-10-16 ENCOUNTER — Encounter: Payer: Self-pay | Admitting: Family Medicine

## 2019-10-16 ENCOUNTER — Ambulatory Visit: Payer: Managed Care, Other (non HMO) | Admitting: Family Medicine

## 2019-10-16 ENCOUNTER — Telehealth (INDEPENDENT_AMBULATORY_CARE_PROVIDER_SITE_OTHER): Payer: Managed Care, Other (non HMO) | Admitting: Family Medicine

## 2019-10-16 VITALS — BP 116/65 | HR 79 | Wt 170.0 lb

## 2019-10-16 DIAGNOSIS — G4700 Insomnia, unspecified: Secondary | ICD-10-CM

## 2019-10-16 DIAGNOSIS — E782 Mixed hyperlipidemia: Secondary | ICD-10-CM

## 2019-10-16 DIAGNOSIS — E785 Hyperlipidemia, unspecified: Secondary | ICD-10-CM | POA: Insufficient documentation

## 2019-10-16 DIAGNOSIS — F331 Major depressive disorder, recurrent, moderate: Secondary | ICD-10-CM

## 2019-10-16 MED ORDER — ATORVASTATIN CALCIUM 40 MG PO TABS: 40.0000 mg | ORAL_TABLET | Freq: Every day | ORAL | 1 refills | Status: DC

## 2019-10-16 MED ORDER — ESCITALOPRAM OXALATE 20 MG PO TABS: ORAL_TABLET | ORAL | 1 refills | Status: DC

## 2019-10-16 MED ORDER — BELSOMRA 15 MG PO TABS: 15.0000 mg | ORAL_TABLET | Freq: Every evening | ORAL | 3 refills | Status: DC | PRN

## 2019-10-16 NOTE — Progress Notes (Signed)
BP 116/65   Pulse 79   Wt 170 lb (77.1 kg)   BMI 23.71 kg/m    Subjective:    Patient ID: Virginia Austin, female    DOB: [birth year], 54 y.o.   MRN: [id number]  HPI: Virginia Austin is a 54 y.o. female  Chief Complaint  Patient presents with  . Insomnia  . Anxiety  . Depression    . This visit was completed via WebEx due to the restrictions of the COVID-19 pandemic. All issues as above were discussed and addressed. Physical exam was done as above through visual confirmation on WebEx. If it was felt that the patient should be evaluated in the office, they were directed there. The patient verbally consented to this visit. . Location of the patient: home . Location of the provider: work . Those involved with this call:  . Provider: Merrie Roof, PA-C . CMA: Lesle Chris, [place] . Front Desk/Registration: Jill Side  . Time spent on call: 25 minutes with patient face to face via video conference. More than 50% of this time was spent in counseling and coordination of care. 5 minutes total spent in review of patient's record and preparation of their chart. I verified patient identity using two factors (patient name and date of birth). Patient consents verbally to being seen via telemedicine visit today.   Patient presenting today for 6 month f/u chronic conditions.   HLD - Taking lipitor without side effects. Denies CP, SOB, myalgias, claudication. Eating healthy diet and trying to stay active.   Depression/anxiety - Doing well overall with lexapro regimen. Continues to be under a great deal of stress, but handling things well overall. Denies SI/HI, mood swings, appetite issues.   Insomnia - Continues to use belsomra prn which seems to help.   Depression screen [facility] 2/9 10/16/2019 04/10/2019 06/29/2018  Decreased Interest 0 0 0  Down, Depressed, Hopeless 0 0 0  PHQ - 2 Score 0 0 0  Altered sleeping 0 0 0  Tired, decreased energy 0 0 0  Change in appetite 0 0 0  Feeling bad or failure  about yourself  0 0 0  Trouble concentrating 0 0 0  Moving slowly or fidgety/restless 0 0 0  Suicidal thoughts 0 0 0  PHQ-9 Score 0 0 0   GAD 7 : Generalized Anxiety Score 10/16/2019 04/10/2019 06/29/2018 06/15/2017  Nervous, Anxious, on Edge 0 0 0 3  Control/stop worrying 0 0 0 0  Worry too much - different things 0 0 0 0  Trouble relaxing 1 0 0 3  Restless 0 0 0 3  Easily annoyed or irritable 1 0 0 3  Afraid - awful might happen 0 0 0 0  Total GAD 7 Score 2 0 0 12  Anxiety Difficulty - - Not difficult at all Extremely difficult     Relevant past medical, surgical, family and social history reviewed and updated as indicated. Interim medical history since our last visit reviewed. Allergies and medications reviewed and updated.  Review of Systems  Per HPI unless specifically indicated above     Objective:    BP 116/65   Pulse 79   Wt 170 lb (77.1 kg)   BMI 23.71 kg/m   Wt Readings from Last 3 Encounters:  10/16/19 170 lb (77.1 kg)  06/06/19 167 lb (75.8 kg)  04/10/19 172 lb 12.8 oz (78.4 kg)    Physical Exam Vitals and nursing note reviewed.  Constitutional:      General:  She is not in acute distress.    Appearance: Normal appearance.  HENT:     Head: Atraumatic.     Right Ear: External ear normal.     Left Ear: External ear normal.     Nose: Nose normal. No congestion.     Mouth/Throat:     Mouth: Mucous membranes are moist.     Pharynx: Oropharynx is clear. No posterior oropharyngeal erythema.  Eyes:     Extraocular Movements: Extraocular movements intact.     Conjunctiva/sclera: Conjunctivae normal.  Cardiovascular:     Comments: Unable to assess via virtual visit Pulmonary:     Effort: Pulmonary effort is normal. No respiratory distress.  Musculoskeletal:        General: Normal range of motion.     Cervical back: Normal range of motion.  Skin:    General: Skin is dry.     Findings: No erythema.  Neurological:     Mental Status: She is alert and  oriented to person, place, and time.  Psychiatric:        Mood and Affect: Mood normal.        Thought Content: Thought content normal.        Judgment: Judgment normal.     Results for orders placed or performed in visit on 08/03/19  Novel Coronavirus, NAA ([facility])   Specimen: Nasopharyngeal(NP) swabs in vial transport medium   NASOPHARYNGE  SCREENIN  Result Value Ref Range   SARS-CoV-2, NAA Not Detected Not Detected      Assessment & Plan:   Problem List Items Addressed This Visit      Other   Insomnia - Primary    Stable and under good control, continue current regimen of prn belsomra      Depression    Stable and fairly well controlled, continue current regimen      Relevant Medications   escitalopram (LEXAPRO) 20 MG tablet   Hyperlipidemia    Recheck lipids, adjust as needed. Continue good diet and exercise habits       Relevant Medications   atorvastatin (LIPITOR) 40 MG tablet    Other Visit Diagnoses    Elevated cholesterol with elevated triglycerides       Relevant Medications   atorvastatin (LIPITOR) 40 MG tablet   Other Relevant Orders   Comprehensive metabolic panel   Lipid Panel w/o Chol/HDL Ratio       Follow up plan: Return in about 6 months (around 04/14/2020) for CPE.

## 2019-10-20 NOTE — Assessment & Plan Note (Signed)
Stable and under good control, continue current regimen of prn belsomra

## 2019-10-20 NOTE — Assessment & Plan Note (Signed)
Recheck lipids, adjust as needed. Continue good diet and exercise habits

## 2019-10-20 NOTE — Assessment & Plan Note (Signed)
Stable and fairly well controlled, continue current regimen

## 2019-11-08 ENCOUNTER — Other Ambulatory Visit: Payer: Self-pay | Admitting: Family Medicine

## 2020-04-22 ENCOUNTER — Encounter: Payer: Self-pay | Admitting: Family Medicine

## 2020-04-22 ENCOUNTER — Ambulatory Visit (INDEPENDENT_AMBULATORY_CARE_PROVIDER_SITE_OTHER): Payer: Managed Care, Other (non HMO) | Admitting: Family Medicine

## 2020-04-22 ENCOUNTER — Other Ambulatory Visit: Payer: Self-pay

## 2020-04-22 VITALS — BP 109/73 | HR 72 | Temp 98.5°F | Ht 70.0 in | Wt 177.0 lb

## 2020-04-22 DIAGNOSIS — Z0000 Encounter for general adult medical examination without abnormal findings: Secondary | ICD-10-CM | POA: Diagnosis not present

## 2020-04-22 DIAGNOSIS — Z111 Encounter for screening for respiratory tuberculosis: Secondary | ICD-10-CM

## 2020-04-22 DIAGNOSIS — G4700 Insomnia, unspecified: Secondary | ICD-10-CM

## 2020-04-22 DIAGNOSIS — F331 Major depressive disorder, recurrent, moderate: Secondary | ICD-10-CM | POA: Diagnosis not present

## 2020-04-22 DIAGNOSIS — E782 Mixed hyperlipidemia: Secondary | ICD-10-CM | POA: Diagnosis not present

## 2020-04-22 DIAGNOSIS — Z1159 Encounter for screening for other viral diseases: Secondary | ICD-10-CM

## 2020-04-22 LAB — UA/M W/RFLX CULTURE, ROUTINE
Bilirubin, UA: NEGATIVE
Glucose, UA: NEGATIVE
Ketones, UA: NEGATIVE
Leukocytes,UA: NEGATIVE
Nitrite, UA: NEGATIVE
Protein,UA: NEGATIVE
RBC, UA: NEGATIVE
Specific Gravity, UA: 1.015 (ref 1.005–1.030)
Urobilinogen, Ur: 0.2 mg/dL (ref 0.2–1.0)
pH, UA: 6.5 (ref 5.0–7.5)

## 2020-04-22 NOTE — Assessment & Plan Note (Signed)
Recheck lipids, adjust as needed. Continue lipitor and lifestyle modifications. 

## 2020-04-22 NOTE — Assessment & Plan Note (Signed)
Discussed appropriate max dosing for lexapro, however given her hx of failure of numerous other types of antidepressants will try occasional extra 1/2 tab or full tab to help manage significant agitation. Discussed signs and sxs of serotonin syndrome to watch for. Continue lexapro regimen and counseling

## 2020-04-22 NOTE — Progress Notes (Signed)
BP 109/73   Pulse 72   Temp 98.5 F (36.9 C) (Oral)   Ht 5\' 10"  (1.778 m)   Wt 177 lb (80.3 kg)   SpO2 97%   BMI 25.40 kg/m    Subjective:    Patient ID: Virginia Austin, female    DOB: 1966-04-04, 54 y.o.   MRN: [id number]  HPI: Virginia Austin is a 54 y.o. female presenting on 04/22/2020 for comprehensive medical examination. Current medical complaints include:see below  HLD - taking lipitor daily without issue. Denies myalgias, claudication, CP, SOB. Eats healthy diet and stays active.   Insomnia, depression - Sleeping well with belsomra regimen, no side effects, morning sedation. Tries to practice good sleep hygiene. lexapro seems to be controlling moods well overall, sometimes taking 2 tabs on days she is increasingly agitated which does seem to help calm her down. Denies SI/HI.   Depression screen [facility] 2/9 10/16/2019 04/10/2019 06/29/2018  Decreased Interest 0 0 0  Down, Depressed, Hopeless 0 0 0  PHQ - 2 Score 0 0 0  Altered sleeping 0 0 0  Tired, decreased energy 0 0 0  Change in appetite 0 0 0  Feeling bad or failure about yourself  0 0 0  Trouble concentrating 0 0 0  Moving slowly or fidgety/restless 0 0 0  Suicidal thoughts 0 0 0  PHQ-9 Score 0 0 0   GAD 7 : Generalized Anxiety Score 10/16/2019 04/10/2019 06/29/2018 06/15/2017  Nervous, Anxious, on Edge 0 0 0 3  Control/stop worrying 0 0 0 0  Worry too much - different things 0 0 0 0  Trouble relaxing 1 0 0 3  Restless 0 0 0 3  Easily annoyed or irritable 1 0 0 3  Afraid - awful might happen 0 0 0 0  Total GAD 7 Score 2 0 0 12  Anxiety Difficulty - - Not difficult at all Extremely difficult     She currently lives with: Menopausal Symptoms: no  Depression Screen done today and results listed below:  Depression screen [facility] 2/9 10/16/2019 04/10/2019 06/29/2018 06/27/2018 12/28/2017  Decreased Interest 0 0 0 0 0  Down, Depressed, Hopeless 0 0 0 0 0  PHQ - 2 Score 0 0 0 0 0  Altered sleeping 0 0 0 - 1  Tired, decreased  energy 0 0 0 - 0  Change in appetite 0 0 0 - 0  Feeling bad or failure about yourself  0 0 0 - 0  Trouble concentrating 0 0 0 - 0  Moving slowly or fidgety/restless 0 0 0 - 0  Suicidal thoughts 0 0 0 - 0  PHQ-9 Score 0 0 0 - 1    The patient does not have a history of falls. I did complete a risk assessment for falls. A plan of care for falls was documented.   Past Medical History:  Past Medical History:  Diagnosis Date  . Anxiety   . Arthritis    knees  . Chronic headaches    otc med prn  . Depression   . Dysthymic disorder   . Hyperlipidemia    diet controlled, no meds  . Impaired fasting glucose   . Lumbago   . Neck sprain and strain    Hx  . Vitamin D deficiency    Hx - resolved per patient  . Wears contact lenses     Surgical History:  Past Surgical History:  Procedure Laterality Date  . COLONOSCOPY WITH PROPOFOL N/A 06/06/2019  Procedure: COLONOSCOPY WITH BIOPSY;  Surgeon: [name], MD;  Location: [place];  Service: Endoscopy;  Laterality: N/A;  . DILATATION & CURETTAGE/HYSTEROSCOPY WITH MYOSURE N/A 11/19/2015   Procedure: DILATATION & CURETTAGE/HYSTEROSCOPY WITH MYOSURE;  Surgeon: Brien Few, MD;  Location: [facility];  Service: Gynecology;  Laterality: N/A;  . HERNIA REPAIR    . POLYPECTOMY N/A 06/06/2019   Procedure: POLYPECTOMY;  Surgeon: [name], MD;  Location: [place];  Service: Endoscopy;  Laterality: N/A;  . SKIN CANCER EXCISION    . TONSILLECTOMY      Medications:  Current Outpatient Medications on File Prior to Visit  Medication Sig  . atorvastatin (LIPITOR) 40 MG tablet Take 1 tablet (40 mg total) by mouth daily.  Marland Kitchen escitalopram (LEXAPRO) 20 MG tablet TAKE 1 TABLET(20 MG TOTAL) BY MOUTH DAILY.  Marland Kitchen estradiol (ESTRACE) 2 MG tablet Take 2 mg by mouth daily.  . Multiple Vitamin (MULTIVITAMIN WITH MINERALS) TABS tablet Take 1 tablet by mouth daily.  . progesterone (PROMETRIUM) 100 MG capsule   . Suvorexant (BELSOMRA) 15  MG TABS Take 15 mg by mouth at bedtime as needed.   No current facility-administered medications on file prior to visit.    Allergies:  Allergies  Allergen Reactions  . Amoxicillin Other (See Comments)    Yeast infection    Social History:  Social History   Socioeconomic History  . Marital status: Married    Spouse name: Not on file  . Number of children: Not on file  . Years of education: Not on file  . Highest education level: Not on file  Occupational History  . Not on file  Tobacco Use  . Smoking status: Never Smoker  . Smokeless tobacco: Never Used  Vaping Use  . Vaping Use: Never used  Substance and Sexual Activity  . Alcohol use: Yes    Alcohol/week: 1.0 standard drink    Types: 1 Cans of beer per week    Comment: occasional beer  . Drug use: No  . Sexual activity: Yes    Birth control/protection: Post-menopausal  Other Topics Concern  . Not on file  Social History Narrative  . Not on file   Social Determinants of Health   Financial Resource Strain:   . Difficulty of Paying Living Expenses:   Food Insecurity:   . Worried About Charity fundraiser in the Last Year:   . Arboriculturist in the Last Year:   Transportation Needs:   . Film/video editor (Medical):   Marland Kitchen Lack of Transportation (Non-Medical):   Physical Activity:   . Days of Exercise per Week:   . Minutes of Exercise per Session:   Stress:   . Feeling of Stress :   Social Connections:   . Frequency of Communication with Friends and Family:   . Frequency of Social Gatherings with Friends and Family:   . Attends Religious Services:   . Active Member of Clubs or Organizations:   . Attends Archivist Meetings:   Marland Kitchen Marital Status:   Intimate Partner Violence:   . Fear of Current or Ex-Partner:   . Emotionally Abused:   Marland Kitchen Physically Abused:   . Sexually Abused:    Social History   Tobacco Use  Smoking Status Never Smoker  Smokeless Tobacco Never Used   Social History    Substance and Sexual Activity  Alcohol Use Yes  . Alcohol/week: 1.0 standard drink  . Types: 1 Cans of beer per week  Comment: occasional beer    Family History:  Family History  Problem Relation Age of Onset  . Diabetes Father   . Hypertension Father   . Hypertension Sister   . Diabetes Paternal Grandmother   . Hypertension Paternal Grandmother   . Thyroid disease Paternal Grandmother     Past medical history, surgical history, medications, allergies, family history and social history reviewed with patient today and changes made to appropriate areas of the chart.   Review of Systems - General ROS: negative Psychological ROS: negative Ophthalmic ROS: negative ENT ROS: negative Allergy and Immunology ROS: negative Hematological and Lymphatic ROS: negative Endocrine ROS: negative Breast ROS: negative for breast lumps Respiratory ROS: no cough, shortness of breath, or wheezing Cardiovascular ROS: no chest pain or dyspnea on exertion Gastrointestinal ROS: no abdominal pain, change in bowel habits, or black or bloody stools Genito-Urinary ROS: no dysuria, trouble voiding, or hematuria Musculoskeletal ROS: negative Neurological ROS: no TIA or stroke symptoms Dermatological ROS: negative All other ROS negative except what is listed above and in the HPI.      Objective:    BP 109/73   Pulse 72   Temp 98.5 F (36.9 C) (Oral)   Ht 5\' 10"  (1.778 m)   Wt 177 lb (80.3 kg)   SpO2 97%   BMI 25.40 kg/m   Wt Readings from Last 3 Encounters:  04/22/20 177 lb (80.3 kg)  10/16/19 170 lb (77.1 kg)  06/06/19 167 lb (75.8 kg)    Physical Exam Vitals and nursing note reviewed.  Constitutional:      General: She is not in acute distress.    Appearance: She is well-developed.  HENT:     Head: Atraumatic.     Right Ear: External ear normal.     Left Ear: External ear normal.     Nose: Nose normal.     Mouth/Throat:     Pharynx: No oropharyngeal exudate.  Eyes:      General: No scleral icterus.    Conjunctiva/sclera: Conjunctivae normal.     Pupils: Pupils are equal, round, and reactive to light.  Neck:     Thyroid: No thyromegaly.  Cardiovascular:     Rate and Rhythm: Normal rate and regular rhythm.     Heart sounds: Normal heart sounds.  Pulmonary:     Effort: Pulmonary effort is normal. No respiratory distress.     Breath sounds: Normal breath sounds.  Chest:     Comments: Breast exam done through GYN Abdominal:     General: Bowel sounds are normal.     Palpations: Abdomen is soft. There is no mass.     Tenderness: There is no abdominal tenderness.  Genitourinary:    Comments: GU exam done through GYN Musculoskeletal:        General: No tenderness. Normal range of motion.     Cervical back: Normal range of motion and neck supple.  Lymphadenopathy:     Cervical: No cervical adenopathy.  Skin:    General: Skin is warm and dry.     Findings: No rash.  Neurological:     Mental Status: She is alert and oriented to person, place, and time.     Cranial Nerves: No cranial nerve deficit.  Psychiatric:        Behavior: Behavior normal.     Results for orders placed or performed in visit on 08/03/19  Novel Coronavirus, NAA ([facility])   Specimen: Nasopharyngeal(NP) swabs in vial transport medium   [place]  Result Value Ref Range   SARS-CoV-2, NAA Not Detected Not Detected      Assessment & Plan:   Problem List Items Addressed This Visit      Other   Insomnia    Stable and well controlled, continue belsomra regimen      Depression - Primary    Discussed appropriate max dosing for lexapro, however given her hx of failure of numerous other types of antidepressants will try occasional extra 1/2 tab or full tab to help manage significant agitation. Discussed signs and sxs of serotonin syndrome to watch for. Continue lexapro regimen and counseling      Hyperlipidemia    Recheck lipids, adjust as needed. Continue lipitor  and lifestyle modifications      Relevant Orders   Comprehensive metabolic panel   Lipid Panel w/o Chol/HDL Ratio    Other Visit Diagnoses    Annual physical exam       Relevant Orders   CBC with Differential/Platelet   UA/M w/rflx Culture, Routine   Screening for tuberculosis       Relevant Orders   QuantiFERON-TB Gold Plus   Need for hepatitis C screening test       Relevant Orders   Hepatitis C antibody       Follow up plan: Return in about 6 months (around 10/23/2020) for 6 month f/u.   LABORATORY TESTING:  - Pap smear: up to date  IMMUNIZATIONS:   - Tdap: Tetanus vaccination status reviewed: last tetanus booster within 10 years. - Influenza: Up to date  SCREENING: -Mammogram: Up to date  - Colonoscopy: Up to date   PATIENT COUNSELING:   Advised to take 1 mg of folate supplement per day if capable of pregnancy.   Sexuality: Discussed sexually transmitted diseases, partner selection, use of condoms, avoidance of unintended pregnancy  and contraceptive alternatives.   Advised to avoid cigarette smoking.  I discussed with the patient that most people either abstain from alcohol or drink within safe limits (<=14/week and <=4 drinks/occasion for males, <=7/weeks and <= 3 drinks/occasion for females) and that the risk for alcohol disorders and other health effects rises proportionally with the number of drinks per week and how often a drinker exceeds daily limits.  Discussed cessation/primary prevention of drug use and availability of treatment for abuse.   Diet: Encouraged to adjust caloric intake to maintain  or achieve ideal body weight, to reduce intake of dietary saturated fat and total fat, to limit sodium intake by avoiding high sodium foods and not adding table salt, and to maintain adequate dietary potassium and calcium preferably from fresh fruits, vegetables, and low-fat dairy products.    stressed the importance of regular exercise  Injury prevention:  Discussed safety belts, safety helmets, smoke detector, smoking near bedding or upholstery.   Dental health: Discussed importance of regular tooth brushing, flossing, and dental visits.    NEXT PREVENTATIVE PHYSICAL DUE IN 1 YEAR. Return in about 6 months (around 10/23/2020) for 6 month f/u.

## 2020-04-22 NOTE — Assessment & Plan Note (Signed)
Stable and well controlled, continue belsomra regimen

## 2020-04-23 MED ORDER — BELSOMRA 15 MG PO TABS: 15.0000 mg | ORAL_TABLET | Freq: Every evening | ORAL | 3 refills | Status: DC | PRN

## 2020-04-23 MED ORDER — ESCITALOPRAM OXALATE 20 MG PO TABS: 20.0000 mg | ORAL_TABLET | Freq: Every day | ORAL | 1 refills | Status: DC

## 2020-04-23 MED ORDER — ATORVASTATIN CALCIUM 40 MG PO TABS: 40.0000 mg | ORAL_TABLET | Freq: Every day | ORAL | 1 refills | Status: DC

## 2020-04-23 NOTE — Addendum Note (Signed)
Addended by: Merrie Roof E on: 04/23/2020 04:22 AM   Modules accepted: Orders

## 2020-04-25 LAB — COMPREHENSIVE METABOLIC PANEL
ALT: 28 IU/L (ref 0–32)
AST: 25 IU/L (ref 0–40)
Albumin/Globulin Ratio: 1.6 (ref 1.2–2.2)
Albumin: 4.5 g/dL (ref 3.8–4.9)
Alkaline Phosphatase: 107 IU/L (ref 48–121)
BUN/Creatinine Ratio: 16 (ref 9–23)
BUN: 12 mg/dL (ref 6–24)
Bilirubin Total: 0.3 mg/dL (ref 0.0–1.2)
CO2: 22 mmol/L (ref 20–29)
Calcium: 9.3 mg/dL (ref 8.7–10.2)
Chloride: 99 mmol/L (ref 96–106)
Creatinine, Ser: 0.76 mg/dL (ref 0.57–1.00)
GFR calc Af Amer: 102 mL/min/{1.73_m2} (ref >59)
GFR calc non Af Amer: 89 mL/min/{1.73_m2} (ref >59)
Globulin, Total: 2.9 g/dL (ref 1.5–4.5)
Glucose: 98 mg/dL (ref 65–99)
Potassium: 4.2 mmol/L (ref 3.5–5.2)
Sodium: 135 mmol/L (ref 134–144)
Total Protein: 7.4 g/dL (ref 6.0–8.5)

## 2020-04-25 LAB — LIPID PANEL W/O CHOL/HDL RATIO
Cholesterol, Total: 328 mg/dL — ABNORMAL HIGH (ref 100–199)
HDL: 53 mg/dL (ref >39)
LDL Chol Calc (NIH): 215 mg/dL — ABNORMAL HIGH (ref 0–99)
Triglycerides: 296 mg/dL — ABNORMAL HIGH (ref 0–149)
VLDL Cholesterol Cal: 60 mg/dL — ABNORMAL HIGH (ref 5–40)

## 2020-04-25 LAB — CBC WITH DIFFERENTIAL/PLATELET
Basophils Absolute: 0.1 10*3/uL (ref 0.0–0.2)
Basos: 1 %
EOS (ABSOLUTE): 0.2 10*3/uL (ref 0.0–0.4)
Eos: 3 %
Hematocrit: 42 % (ref 34.0–46.6)
Hemoglobin: 14 g/dL (ref 11.1–15.9)
Immature Grans (Abs): 0 10*3/uL (ref 0.0–0.1)
Immature Granulocytes: 0 %
Lymphocytes Absolute: 2.5 10*3/uL (ref 0.7–3.1)
Lymphs: 49 %
MCH: 31.5 pg (ref 26.6–33.0)
MCHC: 33.3 g/dL (ref 31.5–35.7)
MCV: 94 fL (ref 79–97)
Monocytes Absolute: 0.5 10*3/uL (ref 0.1–0.9)
Monocytes: 9 %
Neutrophils Absolute: 1.9 10*3/uL (ref 1.4–7.0)
Neutrophils: 38 %
Platelets: 340 10*3/uL (ref 150–450)
RBC: 4.45 x10E6/uL (ref 3.77–5.28)
RDW: 13.1 % (ref 11.7–15.4)
WBC: 5.1 10*3/uL (ref 3.4–10.8)

## 2020-04-25 LAB — QUANTIFERON-TB GOLD PLUS
QuantiFERON Mitogen Value: >10 IU/mL
QuantiFERON Nil Value: 0.02 IU/mL
QuantiFERON TB1 Ag Value: 0.07 IU/mL
QuantiFERON TB2 Ag Value: 0.08 IU/mL
QuantiFERON-TB Gold Plus: NEGATIVE

## 2020-04-25 LAB — HEPATITIS C ANTIBODY: Hep C Virus Ab: <0.1 s/co ratio (ref 0.0–0.9)

## 2020-04-26 ENCOUNTER — Other Ambulatory Visit: Payer: Self-pay | Admitting: Family Medicine

## 2020-04-26 MED ORDER — ATORVASTATIN CALCIUM 80 MG PO TABS: 80.0000 mg | ORAL_TABLET | Freq: Every day | ORAL | 1 refills | Status: DC

## 2020-04-27 ENCOUNTER — Telehealth: Payer: Self-pay

## 2020-04-27 NOTE — Telephone Encounter (Signed)
Patient notified of form ready for pick up

## 2020-06-04 ENCOUNTER — Other Ambulatory Visit: Payer: Self-pay | Admitting: Family Medicine

## 2020-08-03 IMAGING — CR DG LUMBAR SPINE 2-3V
3 series · 3 of 3 positions shown · non-contrast
Comparison: None.

CLINICAL DATA: Low back pain.  No injury.

EXAM:
LUMBAR SPINE - 2-3 VIEW

[w lumbar spine ap]
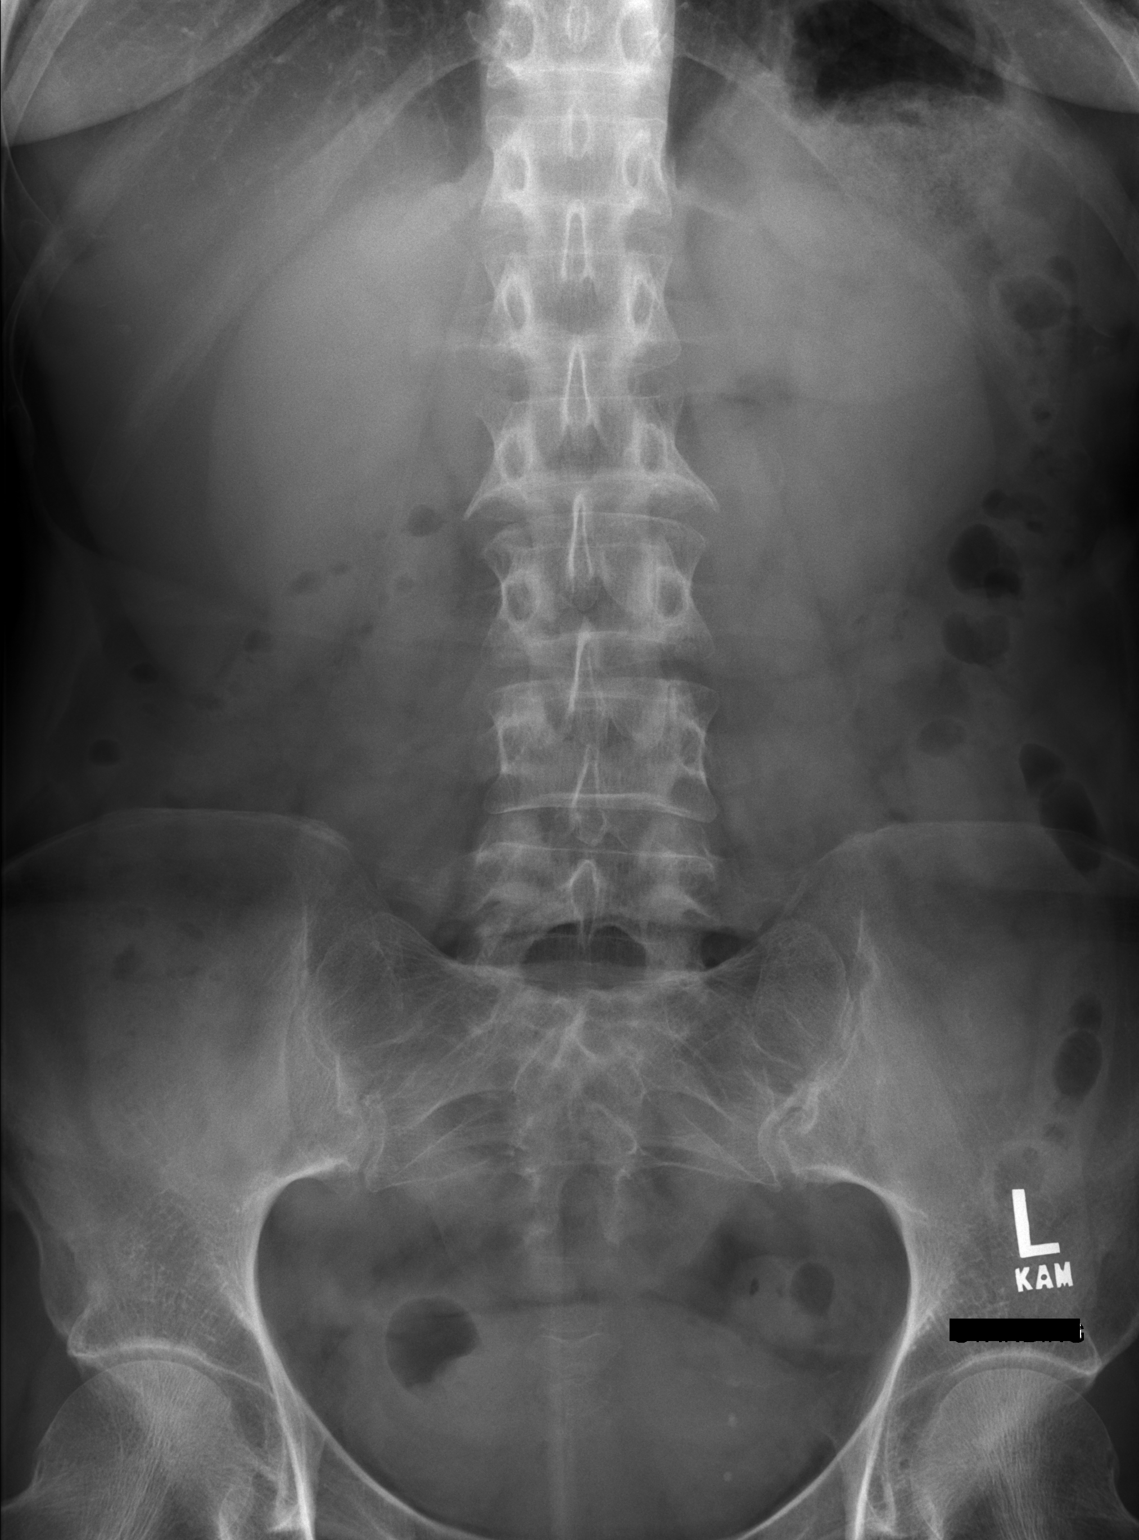

[w lumbar spine lat]
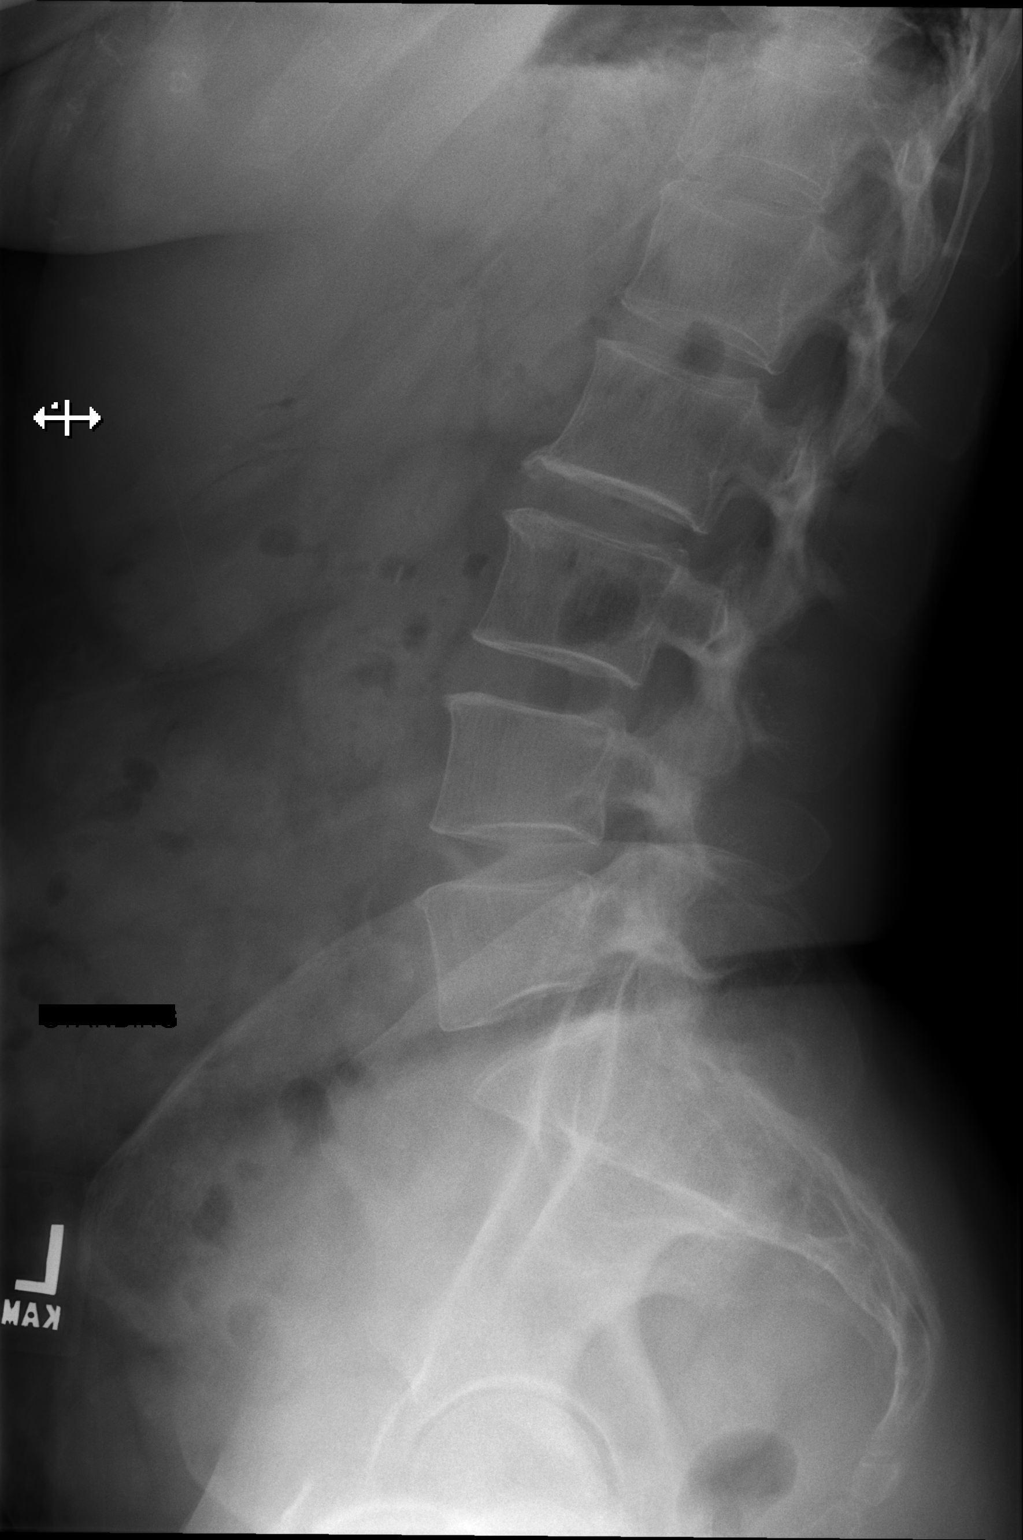

[w lumbar l-5 s-1 spot]
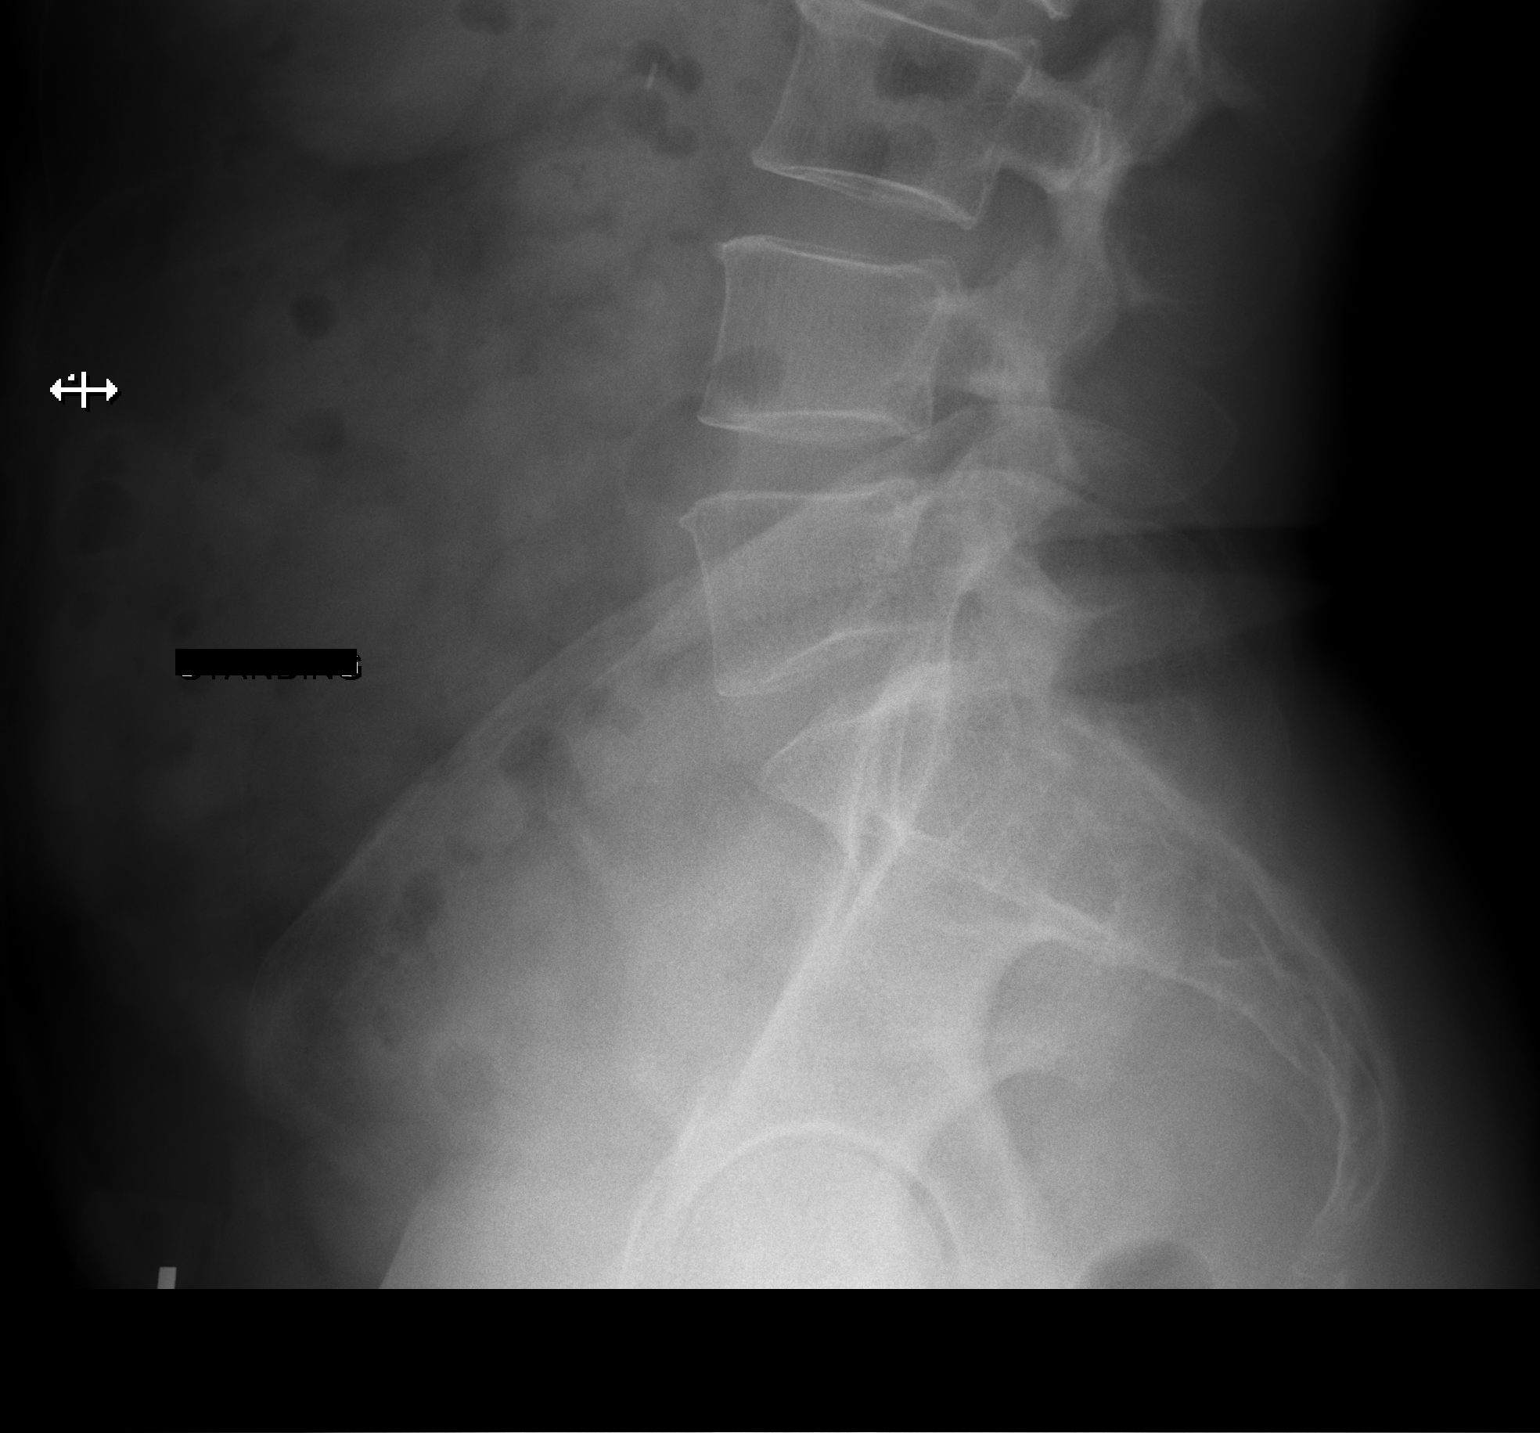

[3 of 3 positions shown; findings below may reference images not displayed]

FINDINGS: There is no evidence of lumbar spine fracture. Alignment is normal.
Intervertebral disc spaces are maintained. Minimal anterior
osteophytosis is identified at L2, L3 and L4.
IMPRESSION: Minimal degenerative joint changes of lumbar spine.

## 2020-09-14 ENCOUNTER — Other Ambulatory Visit: Payer: Self-pay | Admitting: Family Medicine

## 2020-09-29 LAB — HM MAMMOGRAPHY

## 2020-10-23 ENCOUNTER — Other Ambulatory Visit: Payer: Self-pay

## 2020-10-23 ENCOUNTER — Ambulatory Visit (INDEPENDENT_AMBULATORY_CARE_PROVIDER_SITE_OTHER): Payer: Managed Care, Other (non HMO) | Admitting: Nurse Practitioner

## 2020-10-23 ENCOUNTER — Encounter: Payer: Self-pay | Admitting: Nurse Practitioner

## 2020-10-23 ENCOUNTER — Ambulatory Visit: Payer: Managed Care, Other (non HMO) | Admitting: Nurse Practitioner

## 2020-10-23 VITALS — BP 113/80 | HR 73 | Temp 98.0°F | Ht 70.71 in | Wt 180.8 lb

## 2020-10-23 DIAGNOSIS — F331 Major depressive disorder, recurrent, moderate: Secondary | ICD-10-CM

## 2020-10-23 DIAGNOSIS — M79642 Pain in left hand: Secondary | ICD-10-CM | POA: Diagnosis not present

## 2020-10-23 DIAGNOSIS — E782 Mixed hyperlipidemia: Secondary | ICD-10-CM | POA: Diagnosis not present

## 2020-10-23 DIAGNOSIS — F419 Anxiety disorder, unspecified: Secondary | ICD-10-CM | POA: Diagnosis not present

## 2020-10-23 NOTE — Assessment & Plan Note (Signed)
Chronic, ongoing.  Denies SI/HI.  Continue current medication regimen and adjust as needed.  Tolerating Lexpro well.

## 2020-10-23 NOTE — Assessment & Plan Note (Signed)
For weeks, intermittent.  Suspect some tendonitis due to overuse.  Recommend use of wrist support at work and ensuring rest when at home.  Use of heat as needed.  Voltaren gel applied to area as needed.  For worsening or ongoing return to office.

## 2020-10-23 NOTE — Progress Notes (Signed)
BP 113/80   Pulse 73   Temp 98 F (36.7 C) (Oral)   Ht 5' 10.71" (1.796 m)   Wt 180 lb 12.8 oz (82 kg)   SpO2 98%   BMI 25.42 kg/m    Subjective:    Patient ID: Virginia Austin, female    DOB: [birth year], 55 y.o.   MRN: [id number]  HPI: Virginia Austin is a 55 y.o. female  Chief Complaint  Patient presents with  . Hyperlipidemia  . Depression  . Hand Pain   HYPERLIPIDEMIA Continues on Atorvastatin 80 MG.  Hyperlipidemia status: good compliance Satisfied with current treatment?  yes Side effects:  no Medication compliance: good compliance Supplements: none Aspirin:  no The ASCVD Risk score Mikey Bussing DC Jr., et al., 2013) failed to calculate for the following reasons:   The valid total cholesterol range is 130 to 320 mg/dL Chest pain:  no Coronary artery disease:  no Family history CAD:  no Family history early CAD:  no   HAND PAIN (LEFT) Reports decrease grip strength and occasional discomfort with touching.  Teacher's assistant and uses hands often.  Is right hand dominant.  Duration: months Involved hand: left Mechanism of injury: unknown Location: medial Onset: gradual Severity: moderate  Quality: sharp and aching Frequency: notices it more with use of left hand, less with rest Radiation: no Aggravating factors: using hand  Alleviating factors: resting Treatments attempted: Ibuprofen Relief with NSAIDs?: no Weakness: yes Numbness: no Redness: no Swelling:no Bruising: no Fevers: no  DEPRESSION Continues on Lexapro 20 MG.  In past took [place] for sleep, but does not need anything anymore. Mood status: controlled Satisfied with current treatment?: yes Symptom severity: mild  Duration of current treatment : chronic Side effects: no Medication compliance: good compliance Psychotherapy/counseling: yes current Depressed mood: no Anxious mood: no Anhedonia: no Significant weight loss or gain: no Insomnia: no none Fatigue: no Feelings of worthlessness or  guilt: no Impaired concentration/indecisiveness: no Suicidal ideations: no Hopelessness: no Crying spells: no Depression screen [facility] 2/9 10/23/2020 04/22/2020 10/16/2019 04/10/2019 06/29/2018  Decreased Interest 0 0 0 0 0  Down, Depressed, Hopeless 0 0 0 0 0  PHQ - 2 Score 0 0 0 0 0  Altered sleeping 0 0 0 0 0  Tired, decreased energy 1 0 0 0 0  Change in appetite 0 0 0 0 0  Feeling bad or failure about yourself  0 0 0 0 0  Trouble concentrating 0 0 0 0 0  Moving slowly or fidgety/restless 0 0 0 0 0  Suicidal thoughts 0 0 0 0 0  PHQ-9 Score 1 0 0 0 0  Some recent data might be hidden    Relevant past medical, surgical, family and social history reviewed and updated as indicated. Interim medical history since our last visit reviewed. Allergies and medications reviewed and updated.  Review of Systems  Constitutional: Negative for activity change, appetite change, diaphoresis, fatigue and fever.  Respiratory: Negative for cough, chest tightness, shortness of breath and wheezing.   Cardiovascular: Negative for chest pain, palpitations and leg swelling.  Gastrointestinal: Negative.   Neurological: Negative.   Psychiatric/Behavioral: Negative.     Per HPI unless specifically indicated above     Objective:    BP 113/80   Pulse 73   Temp 98 F (36.7 C) (Oral)   Ht 5' 10.71" (1.796 m)   Wt 180 lb 12.8 oz (82 kg)   SpO2 98%   BMI 25.42 kg/m   Wt  Readings from Last 3 Encounters:  10/23/20 180 lb 12.8 oz (82 kg)  04/22/20 177 lb (80.3 kg)  10/16/19 170 lb (77.1 kg)    Physical Exam Vitals and nursing note reviewed.  Constitutional:      General: She is awake. She is not in acute distress.    Appearance: She is well-developed and well-groomed. She is not ill-appearing.  HENT:     Head: Normocephalic.     Right Ear: Hearing normal.     Left Ear: Hearing normal.  Eyes:     General: Lids are normal.        Right eye: No discharge.        Left eye: No discharge.      Conjunctiva/sclera: Conjunctivae normal.     Pupils: Pupils are equal, round, and reactive to light.  Neck:     Thyroid: No thyromegaly.     Vascular: No carotid bruit.  Cardiovascular:     Rate and Rhythm: Normal rate and regular rhythm.     Heart sounds: Normal heart sounds. No murmur heard. No gallop.   Pulmonary:     Effort: Pulmonary effort is normal. No accessory muscle usage or respiratory distress.     Breath sounds: Normal breath sounds.  Abdominal:     General: Bowel sounds are normal.     Palpations: Abdomen is soft. There is no hepatomegaly or splenomegaly.  Musculoskeletal:     Right hand: Normal.     Left hand: Tenderness (mild to medial aspect near wrist) present. No swelling or deformity. Normal range of motion. Decreased strength (very mild grip strength reduction). Normal sensation. Normal capillary refill. Normal pulse.     Cervical back: Normal range of motion and neck supple.     Right lower leg: No edema.     Left lower leg: No edema.     Comments: Mild discomfort reported with ROM touching 2nd finger to thumb. Negative Tinel and Ph[place].  Skin:    General: Skin is warm and dry.  Neurological:     Mental Status: She is alert and oriented to person, place, and time.  Psychiatric:        Attention and Perception: Attention normal.        Mood and Affect: Mood normal.        Speech: Speech normal.        Behavior: Behavior normal. Behavior is cooperative.        Thought Content: Thought content normal.     Results for orders placed or performed in visit on 09/30/20  [facility] MAMMOGRAPHY  Result Value Ref Range   [facility] Mammogram 0-4 Bi-Rad 0-4 Bi-Rad, Self Reported Normal      Assessment & Plan:   Problem List Items Addressed This Visit      Other   Depression - Primary    Chronic, ongoing.  Denies SI/HI.  Continue current medication regimen and adjust as needed.  Tolerating Lexpro well.      Anxiety    Chronic, ongoing.  Denies SI/HI.  Continue current  medication regimen and adjust as needed.  Tolerating Lexpro well.      Hyperlipidemia    Chronic, ongoing.  Continue current medication regimen and adjust as needed, currently on max dose Atorvastatin.  Recheck lipid panel today and BMP.         Relevant Orders   Lipid Panel w/o Chol/HDL Ratio   Basic metabolic panel   Left hand pain    For weeks, intermittent.  Suspect some tendonitis due to overuse.  Recommend use of wrist support at work and ensuring rest when at home.  Use of heat as needed.  Voltaren gel applied to area as needed.  For worsening or ongoing return to office.          Follow up plan: Return in about 6 months (around 04/22/2021) for Due for annual physical after 04/22/21.

## 2020-10-23 NOTE — Assessment & Plan Note (Signed)
Chronic, ongoing.  Continue current medication regimen and adjust as needed, currently on max dose Atorvastatin.  Recheck lipid panel today and BMP.

## 2020-10-23 NOTE — Patient Instructions (Signed)
Preventing High Cholesterol Cholesterol is a white, waxy substance similar to fat that the human body needs to help build cells. The liver makes all the cholesterol that a person's body needs. Having high cholesterol (hypercholesterolemia) increases your risk for heart disease and stroke. Extra or excess cholesterol comes from the food that you eat. High cholesterol can often be prevented with diet and lifestyle changes. If you already have high cholesterol, you can control it with diet, lifestyle changes, and medicines. How can high cholesterol affect me? If you have high cholesterol, fatty deposits (plaques) may build up on the walls of your blood vessels. The blood vessels that carry blood away from your heart are called arteries. Plaques make the arteries narrower and stiffer. This in turn can:  Restrict or block blood flow and cause blood clots to form.  Increase your risk for heart attack and stroke. What can increase my risk for high cholesterol? This condition is more likely to develop in people who:  Eat foods that are high in saturated fat or cholesterol. Saturated fat is mostly found in foods that come from animal sources.  Are overweight.  Are not getting enough exercise.  Have a family history of high cholesterol (familial hypercholesterolemia). What actions can I take to prevent this? Nutrition  Eat less saturated fat.  Avoid trans fats (partially hydrogenated oils). These are often found in margarine and in some baked goods, fried foods, and snacks bought in packages.  Avoid precooked or cured meat, such as bacon, sausages, or meat loaves.  Avoid foods and drinks that have added sugars.  Eat more fruits, vegetables, and whole grains.  Choose healthy sources of protein, such as fish, poultry, lean cuts of red meat, beans, peas, lentils, and nuts.  Choose healthy sources of fat, such as: ? Nuts. ? Vegetable oils, especially olive oil. ? Fish that have healthy fats,  such as omega-3 fatty acids. These fish include mackerel or salmon.   Lifestyle  Lose weight if you are overweight. Maintaining a healthy body mass index (BMI) can help prevent or control high cholesterol. It can also lower your risk for diabetes and high blood pressure. Ask your health care provider to help you with a diet and exercise plan to lose weight safely.  Do not use any products that contain nicotine or tobacco, such as cigarettes, e-cigarettes, and chewing tobacco. If you need help quitting, ask your health care provider. Alcohol use  Do not drink alcohol if: ? Your health care provider tells you not to drink. ? You are pregnant, may be pregnant, or are planning to become pregnant.  If you drink alcohol: ? Limit how much you use to:  0-1 drink a day for women.  0-2 drinks a day for men. ? Be aware of how much alcohol is in your drink. In the [place] one drink equals one 12 oz bottle of beer (355 mL), one 5 oz glass of wine (148 mL), or one 1 oz glass of hard liquor (44 mL). Activity  Get enough exercise. Do exercises as told by your health care provider.  Each week, do at least 150 minutes of exercise that takes a medium level of effort (moderate-intensity exercise). This kind of exercise: ? Makes your heart beat faster while allowing you to still be able to talk. ? Can be done in short sessions several times a day or longer sessions a few times a week. For example, on 5 days each week, you could walk fast or ride   your bike 3 times a day for 10 minutes each time.   Medicines  Your health care provider may recommend medicines to help lower cholesterol. This may be a medicine to lower the amount of cholesterol that your liver makes. You may need medicine if: ? Diet and lifestyle changes have not lowered your cholesterol enough. ? You have high cholesterol and other risk factors for heart disease or stroke.  Take over-the-counter and prescription medicines only as told by your  health care provider. General information  Manage your risk factors for high cholesterol. Talk with your health care provider about all your risk factors and how to lower your risk.  Manage other conditions that you have, such as diabetes or high blood pressure (hypertension).  Have blood tests to check your cholesterol levels at regular points in time as told by your health care provider.  Keep all follow-up visits as told by your health care provider. This is important. Where to find more information  American Heart Association: www.heart.org  National Heart, Lung, and Blood Institute: www.nhlbi.nih.gov Summary  High cholesterol increases your risk for heart disease and stroke. By keeping your cholesterol level low, you can reduce your risk for these conditions.  High cholesterol can often be prevented with diet and lifestyle changes.  Work with your health care provider to manage your risk factors, and have your blood tested regularly. This information is not intended to replace advice given to you by your health care provider. Make sure you discuss any questions you have with your health care provider. Document Revised: 06/11/2019 Document Reviewed: 06/11/2019 Elsevier Patient Education  2021 Elsevier Inc.  

## 2020-10-24 LAB — BASIC METABOLIC PANEL
BUN/Creatinine Ratio: 16 (ref 9–23)
BUN: 12 mg/dL (ref 6–24)
CO2: 23 mmol/L (ref 20–29)
Calcium: 9.9 mg/dL (ref 8.7–10.2)
Chloride: 100 mmol/L (ref 96–106)
Creatinine, Ser: 0.74 mg/dL (ref 0.57–1.00)
GFR calc Af Amer: 105 mL/min/{1.73_m2} (ref >59)
GFR calc non Af Amer: 91 mL/min/{1.73_m2} (ref >59)
Glucose: 99 mg/dL (ref 65–99)
Potassium: 4.3 mmol/L (ref 3.5–5.2)
Sodium: 140 mmol/L (ref 134–144)

## 2020-10-24 LAB — LIPID PANEL W/O CHOL/HDL RATIO
Cholesterol, Total: 207 mg/dL — ABNORMAL HIGH (ref 100–199)
HDL: 57 mg/dL (ref >39)
LDL Chol Calc (NIH): 118 mg/dL — ABNORMAL HIGH (ref 0–99)
Triglycerides: 186 mg/dL — ABNORMAL HIGH (ref 0–149)
VLDL Cholesterol Cal: 32 mg/dL (ref 5–40)

## 2020-10-24 NOTE — Progress Notes (Signed)
Contacted via [place] evening Virginia Austin, your labs have returned.  Cholesterol levels are improving from previous, but LDL remains above goal.  Would like to see this <70 for stroke prevention.  Were you fasting at time of visit?  If not it would be good to check it fasting.  If you were fasting then it may be good to add on Zetia to your regimen, which you would take along with your Atorvastatin.  You are at the max dose Atorvastatin right now.  Thoughts or questions?  Kidney function and electrolytes look normal.  Have a great evening and it was lovely to meet you:) Keep being awesome!!  Thank you for allowing me to participate in your care. Kindest regards, Justyce Yeater

## 2020-10-28 ENCOUNTER — Other Ambulatory Visit: Payer: Self-pay | Admitting: Nurse Practitioner

## 2020-10-28 MED ORDER — EZETIMIBE 10 MG PO TABS: 10.0000 mg | ORAL_TABLET | Freq: Every day | ORAL | 3 refills | Status: DC

## 2020-11-05 NOTE — Telephone Encounter (Signed)
Routing to provider  

## 2020-12-07 ENCOUNTER — Ambulatory Visit (INDEPENDENT_AMBULATORY_CARE_PROVIDER_SITE_OTHER): Payer: Managed Care, Other (non HMO) | Admitting: Nurse Practitioner

## 2020-12-07 ENCOUNTER — Encounter: Payer: Self-pay | Admitting: Nurse Practitioner

## 2020-12-07 ENCOUNTER — Other Ambulatory Visit: Payer: Self-pay

## 2020-12-07 VITALS — BP 103/67 | HR 69 | Temp 98.2°F | Wt 177.0 lb

## 2020-12-07 DIAGNOSIS — W57XXXA Bitten or stung by nonvenomous insect and other nonvenomous arthropods, initial encounter: Secondary | ICD-10-CM | POA: Diagnosis not present

## 2020-12-07 DIAGNOSIS — R21 Rash and other nonspecific skin eruption: Secondary | ICD-10-CM | POA: Diagnosis not present

## 2020-12-07 MED ORDER — DOXYCYCLINE HYCLATE 100 MG PO TABS: 100.0000 mg | ORAL_TABLET | Freq: Two times a day (BID) | ORAL | 0 refills | Status: DC

## 2020-12-07 MED ORDER — PREDNISONE 10 MG PO TABS: ORAL_TABLET | ORAL | 0 refills | Status: DC

## 2020-12-07 MED ORDER — TRIAMCINOLONE ACETONIDE 0.1 % EX CREA: 1.0000 "application " | TOPICAL_CREAM | Freq: Two times a day (BID) | CUTANEOUS | 0 refills | Status: DC

## 2020-12-07 NOTE — Assessment & Plan Note (Signed)
Refer to rash plan of care for further.

## 2020-12-07 NOTE — Assessment & Plan Note (Signed)
Presenting on Saturday after a bug bite.  Multiple bites present to right upper thigh with erythema and edema + pruritic in nature.  Will send in Prednisone taper + script for Doxycycline and Triamcinolone cream.  Recommend to use Claritin as needed + may continue ice as needed for comfort.  Continue to monitor area and return to office for any worsening or ongoing symptoms.

## 2020-12-07 NOTE — Progress Notes (Signed)
BP 103/67   Pulse 69   Temp 98.2 F (36.8 C) (Oral)   Wt 177 lb (80.3 kg)   SpO2 97%   BMI 24.89 kg/m    Subjective:    Patient ID: Virginia Austin, female    DOB: 05/19/1966, 55 y.o.   MRN: [id number]  HPI: Virginia Austin is a 55 y.o. female  Chief Complaint  Patient presents with  . Insect Bite    Patient states she was working in the yard Saturday and do not know if something bit through her jeans or crawl up her jeans, but she has multiple bites on her right inner thigh. Patient states she has taking a baking soda bath, Benadryl, Hydrocortisone cream and lavender, the only thing that helped was ice to the area.   BUG BITE Was gardening on Saturday, was wearing jeans, and has multiple bug to right inner thigh.  Has been using Hydrocortisone, baking soda, Benadryl, and ice.   Duration: 2 days Location: right inner thigh Onset: 2 days ago Itching: yes Status: worse Treatments attempted: as above Fever: no Chills: no headache: no Muscle pain: no Rash: no  Relevant past medical, surgical, family and social history reviewed and updated as indicated. Interim medical history since our last visit reviewed. Allergies and medications reviewed and updated.  Review of Systems  Constitutional: Negative for activity change, appetite change, diaphoresis, fatigue and fever.  Respiratory: Negative for cough, chest tightness, shortness of breath and wheezing.   Cardiovascular: Negative for chest pain, palpitations and leg swelling.  Gastrointestinal: Negative.   Skin: Positive for rash.  Neurological: Negative.   Psychiatric/Behavioral: Negative.     Per HPI unless specifically indicated above     Objective:    BP 103/67   Pulse 69   Temp 98.2 F (36.8 C) (Oral)   Wt 177 lb (80.3 kg)   SpO2 97%   BMI 24.89 kg/m   Wt Readings from Last 3 Encounters:  12/07/20 177 lb (80.3 kg)  10/23/20 180 lb 12.8 oz (82 kg)  04/22/20 177 lb (80.3 kg)    Physical Exam Vitals and  nursing note reviewed.  Constitutional:      General: She is awake. She is not in acute distress.    Appearance: She is well-developed and well-groomed. She is not ill-appearing.  HENT:     Head: Normocephalic.     Right Ear: Hearing normal.     Left Ear: Hearing normal.  Eyes:     General: Lids are normal.        Right eye: No discharge.        Left eye: No discharge.     Conjunctiva/sclera: Conjunctivae normal.     Pupils: Pupils are equal, round, and reactive to light.  Neck:     Thyroid: No thyromegaly.     Vascular: No carotid bruit.  Cardiovascular:     Rate and Rhythm: Normal rate and regular rhythm.     Heart sounds: Normal heart sounds. No murmur heard. No gallop.   Pulmonary:     Effort: Pulmonary effort is normal. No accessory muscle usage or respiratory distress.     Breath sounds: Normal breath sounds.  Abdominal:     General: Bowel sounds are normal.     Palpations: Abdomen is soft. There is no hepatomegaly or splenomegaly.  Musculoskeletal:     Cervical back: Normal range of motion and neck supple.     Right lower leg: No edema.  Left lower leg: No edema.  Skin:    General: Skin is warm and dry.     Findings: Rash present.     Comments: Right upper thigh with two raised, red welts present to medial knee area and then moderate erythema present to upper right thigh with warmth and patch of circular erythema to medial aspect thigh with no central clearing.  Area approx 4 cm.  No tenderness.  Neurological:     Mental Status: She is alert and oriented to person, place, and time.  Psychiatric:        Attention and Perception: Attention normal.        Mood and Affect: Mood normal.        Speech: Speech normal.        Behavior: Behavior normal. Behavior is cooperative.        Thought Content: Thought content normal.    Results for orders placed or performed in visit on 10/23/20  Lipid Panel w/o Chol/HDL Ratio  Result Value Ref Range   Cholesterol, Total 207  (H) 100 - 199 mg/dL   Triglycerides 186 (H) 0 - 149 mg/dL   HDL 57 >39 mg/dL   VLDL Cholesterol Cal 32 5 - 40 mg/dL   LDL Chol Calc (NIH) 118 (H) 0 - 99 mg/dL  Basic metabolic panel  Result Value Ref Range   Glucose 99 65 - 99 mg/dL   BUN 12 6 - 24 mg/dL   Creatinine, Ser 0.74 0.57 - 1.00 mg/dL   GFR calc non Af Amer 91 >59 mL/min/1.73   GFR calc Af Amer 105 >59 mL/min/1.73   BUN/Creatinine Ratio 16 9 - 23   Sodium 140 134 - 144 mmol/L   Potassium 4.3 3.5 - 5.2 mmol/L   Chloride 100 96 - 106 mmol/L   CO2 23 20 - 29 mmol/L   Calcium 9.9 8.7 - 10.2 mg/dL      Assessment & Plan:   Problem List Items Addressed This Visit      Musculoskeletal and Integument   Rash - Primary    Presenting on Saturday after a bug bite.  Multiple bites present to right upper thigh with erythema and edema + pruritic in nature.  Will send in Prednisone taper + script for Doxycycline and Triamcinolone cream.  Recommend to use Claritin as needed + may continue ice as needed for comfort.  Continue to monitor area and return to office for any worsening or ongoing symptoms.        Other   Bug bite    Refer to rash plan of care for further.          Follow up plan: Return if symptoms worsen or fail to improve.

## 2020-12-07 NOTE — Patient Instructions (Signed)

## 2021-03-01 ENCOUNTER — Other Ambulatory Visit: Payer: Self-pay

## 2021-03-01 MED ORDER — ESCITALOPRAM OXALATE 20 MG PO TABS: 20.0000 mg | ORAL_TABLET | Freq: Every day | ORAL | 1 refills | Status: DC

## 2021-03-01 NOTE — Telephone Encounter (Signed)
Patient has physical scheduled for 04/26/21

## 2021-03-08 ENCOUNTER — Encounter: Payer: Self-pay | Admitting: Nurse Practitioner

## 2021-03-31 ENCOUNTER — Encounter: Payer: Self-pay | Admitting: Internal Medicine

## 2021-03-31 ENCOUNTER — Other Ambulatory Visit: Payer: Self-pay

## 2021-03-31 ENCOUNTER — Ambulatory Visit: Payer: Managed Care, Other (non HMO) | Admitting: Internal Medicine

## 2021-03-31 VITALS — BP 115/78 | HR 66 | Temp 98.0°F | Ht 70.98 in | Wt 178.2 lb

## 2021-03-31 DIAGNOSIS — Z09 Encounter for follow-up examination after completed treatment for conditions other than malignant neoplasm: Secondary | ICD-10-CM

## 2021-03-31 DIAGNOSIS — S91119S Laceration without foreign body of unspecified toe without damage to nail, sequela: Secondary | ICD-10-CM

## 2021-03-31 DIAGNOSIS — S91114D Laceration without foreign body of right lesser toe(s) without damage to nail, subsequent encounter: Secondary | ICD-10-CM

## 2021-03-31 NOTE — Progress Notes (Signed)
BP 115/78   Pulse 66   Temp 98 F (36.7 C) (Oral)   Ht 5' 10.98" (1.803 m)   Wt 178 lb 3.2 oz (80.8 kg)   SpO2 98%   BMI 24.87 kg/m    Subjective:    Patient ID: Virginia Austin, female    DOB: Nov 29, 1965, 55 y.o.   MRN: [id number]  Chief Complaint  Patient presents with  . Hospitalization Follow-up    For suture removal Right 3rd toe. , happened 7/9    HPI: Virginia Austin is a 55 y.o. female  Patient is here for an ER follow up per pt she Dropped a mirror on her foot.  No pain feels the stiches against the toes.  Pt had a laceration  of the lateral aspect of the right fourth toe with normal motor and sensory function of the time of the ER visit. Her x-rays did not show any signs of foreign body or underlying fractures.  Patient had laceration repair a nerve block procedure performed at  [facility] on 7/9.   Wound Check There has been no drainage from the wound. There is no redness present. There is no swelling present. There is no pain present. She has no difficulty moving the affected extremity or digit.   Chief Complaint  Patient presents with  . Hospitalization Follow-up    For suture removal Right 3rd toe. , happened 7/9    Relevant past medical, surgical, family and social history reviewed and updated as indicated. Interim medical history since our last visit reviewed. Allergies and medications reviewed and updated.  Review of Systems  Per HPI unless specifically indicated above     Objective:    BP 115/78   Pulse 66   Temp 98 F (36.7 C) (Oral)   Ht 5' 10.98" (1.803 m)   Wt 178 lb 3.2 oz (80.8 kg)   SpO2 98%   BMI 24.87 kg/m   Wt Readings from Last 3 Encounters:  03/31/21 178 lb 3.2 oz (80.8 kg)  12/07/20 177 lb (80.3 kg)  10/23/20 180 lb 12.8 oz (82 kg)    Physical Exam Vitals and nursing note reviewed.  Constitutional:      General: She is not in acute distress.    Appearance: Normal appearance. She is not ill-appearing or diaphoretic.  Eyes:      Conjunctiva/sclera: Conjunctivae normal.  Pulmonary:     Breath sounds: No rhonchi.  Abdominal:     General: Abdomen is flat. Bowel sounds are normal. There is no distension.     Palpations: Abdomen is soft. There is no mass.     Tenderness: There is no abdominal tenderness. There is no guarding.  Musculoskeletal:        General: No swelling, tenderness, deformity or signs of injury.  Skin:    General: Skin is warm.     Coloration: Skin is not jaundiced.     Findings: No erythema.     Comments: 5 stitches noted on lateral aspect of right fourth toe no skin approximation obtained yet  Neurological:     Mental Status: She is alert.   Results for orders placed or performed in visit on 10/23/20  Lipid Panel w/o Chol/HDL Ratio  Result Value Ref Range   Cholesterol, Total 207 (H) 100 - 199 mg/dL   Triglycerides 186 (H) 0 - 149 mg/dL   HDL 57 >39 mg/dL   VLDL Cholesterol Cal 32 5 - 40 mg/dL   LDL Chol Calc (NIH) 118 (  H) 0 - 99 mg/dL  Basic metabolic panel  Result Value Ref Range   Glucose 99 65 - 99 mg/dL   BUN 12 6 - 24 mg/dL   Creatinine, Ser 0.74 0.57 - 1.00 mg/dL   GFR calc non Af Amer 91 >59 mL/min/1.73   GFR calc Af Amer 105 >59 mL/min/1.73   BUN/Creatinine Ratio 16 9 - 23   Sodium 140 134 - 144 mmol/L   Potassium 4.3 3.5 - 5.2 mmol/L   Chloride 100 96 - 106 mmol/L   CO2 23 20 - 29 mmol/L   Calcium 9.9 8.7 - 10.2 mg/dL        Current Outpatient Medications:  .  atorvastatin (LIPITOR) 80 MG tablet, Take 1 tablet (80 mg total) by mouth daily., Disp: 90 tablet, Rfl: 1 .  estradiol (ESTRACE) 1 MG tablet, Take 1 mg by mouth daily., Disp: , Rfl:  .  ezetimibe (ZETIA) 10 MG tablet, Take 1 tablet (10 mg total) by mouth daily., Disp: 90 tablet, Rfl: 3 .  progesterone (PROMETRIUM) 100 MG capsule, , Disp: , Rfl:  .  progesterone (PROMETRIUM) 100 MG capsule, Take 100 mg by mouth at bedtime., Disp: , Rfl:  .  triamcinolone (KENALOG) 0.1 %, Apply 1 application topically 2 (two) times  daily., Disp: 30 g, Rfl: 0 .  escitalopram (LEXAPRO) 20 MG tablet, Take 1 tablet (20 mg total) by mouth daily., Disp: 90 tablet, Rfl: 1 .  estradiol (ESTRACE) 2 MG tablet, Take 2 mg by mouth daily. (Patient not taking: Reported on 03/31/2021), Disp: , Rfl:  .  Multiple Vitamin (MULTIVITAMIN WITH MINERALS) TABS tablet, Take 1 tablet by mouth daily., Disp: , Rfl:     Assessment & Plan:  Laceration on the rt toe and water.  Conitnue placing bacitracin on the wound Unhealed wound  Will need to fu for suture removal in 7-10 days.     Problem List Items Addressed This Visit   None

## 2021-04-08 ENCOUNTER — Ambulatory Visit: Payer: Managed Care, Other (non HMO) | Admitting: Nurse Practitioner

## 2021-04-08 ENCOUNTER — Other Ambulatory Visit: Payer: Self-pay

## 2021-04-08 ENCOUNTER — Encounter: Payer: Self-pay | Admitting: Nurse Practitioner

## 2021-04-08 VITALS — BP 103/70 | HR 66 | Temp 98.3°F | Ht 70.12 in | Wt 176.0 lb

## 2021-04-08 DIAGNOSIS — Z4802 Encounter for removal of sutures: Secondary | ICD-10-CM

## 2021-04-08 DIAGNOSIS — S91119A Laceration without foreign body of unspecified toe without damage to nail, initial encounter: Secondary | ICD-10-CM | POA: Insufficient documentation

## 2021-04-08 DIAGNOSIS — S91119S Laceration without foreign body of unspecified toe without damage to nail, sequela: Secondary | ICD-10-CM

## 2021-04-08 MED ORDER — FLUCONAZOLE 150 MG PO TABS: 150.0000 mg | ORAL_TABLET | Freq: Once | ORAL | 0 refills | Status: AC

## 2021-04-08 MED ORDER — SULFAMETHOXAZOLE-TRIMETHOPRIM 800-160 MG PO TABS: 1.0000 | ORAL_TABLET | Freq: Two times a day (BID) | ORAL | 0 refills | Status: DC

## 2021-04-08 NOTE — Assessment & Plan Note (Signed)
5 stitches removed from middle toe of right foot. Recommend leaving OTA and not using bacitracin to help wound heal.  Patient given Bactrim x 10 days due to stiches being in for 20 days. Do not submerge in water, recommend washing with soap and water.

## 2021-04-08 NOTE — Progress Notes (Addendum)
BP 103/70   Pulse 66   Temp 98.3 F (36.8 C)   Ht 5' 10.12" (1.781 m)   Wt 176 lb (79.8 kg)   BMI 25.17 kg/m    Subjective:    Patient ID: Virginia Austin, female    DOB: 03/24/1966, 55 y.o.   MRN: [id number]  HPI: [name] is a 55 y.o. female  Chief Complaint  Patient presents with   Suture / Staple Removal    Toe is puffy, wants to make sure it is not infected   Patient states that she cut her tow on 7/9 and was seen in the emergency room and where they placed 5 stitches. She is here today to have them removed.  At her last visit the edges were not approximated per other provider that saw the patient.    Relevant past medical, surgical, family and social history reviewed and updated as indicated. Interim medical history since our last visit reviewed. Allergies and medications reviewed and updated.  Review of Systems  Skin:        5 stiches on lacerated middle toe of right foot   Per HPI unless specifically indicated above     Objective:    BP 103/70   Pulse 66   Temp 98.3 F (36.8 C)   Ht 5' 10.12" (1.781 m)   Wt 176 lb (79.8 kg)   BMI 25.17 kg/m   Wt Readings from Last 3 Encounters:  04/08/21 176 lb (79.8 kg)  03/31/21 178 lb 3.2 oz (80.8 kg)  12/07/20 177 lb (80.3 kg)    Physical Exam Vitals and nursing note reviewed.  Constitutional:      General: She is not in acute distress.    Appearance: Normal appearance. She is normal weight. She is not ill-appearing, toxic-appearing or diaphoretic.  HENT:     Head: Normocephalic.     Right Ear: External ear normal.     Left Ear: External ear normal.     Nose: Nose normal.     Mouth/Throat:     Mouth: Mucous membranes are moist.     Pharynx: Oropharynx is clear.  Eyes:     General:        Right eye: No discharge.        Left eye: No discharge.     Extraocular Movements: Extraocular movements intact.     Conjunctiva/sclera: Conjunctivae normal.     Pupils: Pupils are equal, round, and reactive to light.   Cardiovascular:     Rate and Rhythm: Normal rate and regular rhythm.     Heart sounds: No murmur heard. Pulmonary:     Effort: Pulmonary effort is normal. No respiratory distress.     Breath sounds: Normal breath sounds. No wheezing or rales.  Musculoskeletal:     Cervical back: Normal range of motion and neck supple.       Feet:  Skin:    General: Skin is warm and dry.     Capillary Refill: Capillary refill takes less than 2 seconds.  Neurological:     General: No focal deficit present.     Mental Status: She is alert and oriented to person, place, and time. Mental status is at baseline.  Psychiatric:        Mood and Affect: Mood normal.        Behavior: Behavior normal.        Thought Content: Thought content normal.        Judgment: Judgment normal.  Results for orders placed or performed in visit on 10/23/20  Lipid Panel w/o Chol/HDL Ratio  Result Value Ref Range   Cholesterol, Total 207 (H) 100 - 199 mg/dL   Triglycerides 186 (H) 0 - 149 mg/dL   HDL 57 >39 mg/dL   VLDL Cholesterol Cal 32 5 - 40 mg/dL   LDL Chol Calc (NIH) 118 (H) 0 - 99 mg/dL  Basic metabolic panel  Result Value Ref Range   Glucose 99 65 - 99 mg/dL   BUN 12 6 - 24 mg/dL   Creatinine, Ser 0.74 0.57 - 1.00 mg/dL   GFR calc non Af Amer 91 >59 mL/min/1.73   GFR calc Af Amer 105 >59 mL/min/1.73   BUN/Creatinine Ratio 16 9 - 23   Sodium 140 134 - 144 mmol/L   Potassium 4.3 3.5 - 5.2 mmol/L   Chloride 100 96 - 106 mmol/L   CO2 23 20 - 29 mmol/L   Calcium 9.9 8.7 - 10.2 mg/dL      Assessment & Plan:   Problem List Items Addressed This Visit       Other   Visit for suture removal - Primary    5 stitches removed from middle toe of right foot. Recommend leaving OTA and not using bacitracin to help wound heal.  Patient given Bactrim x 10 days due to stiches being in for 20 days. Do not submerge in water, recommend washing with soap and water.         Follow up plan: Return if symptoms worsen or  fail to improve.

## 2021-04-09 ENCOUNTER — Ambulatory Visit: Payer: Managed Care, Other (non HMO) | Admitting: Nurse Practitioner

## 2021-04-26 ENCOUNTER — Encounter: Payer: Managed Care, Other (non HMO) | Admitting: Nurse Practitioner

## 2021-05-03 DIAGNOSIS — Z4802 Encounter for removal of sutures: Secondary | ICD-10-CM | POA: Insufficient documentation

## 2021-05-03 NOTE — Assessment & Plan Note (Signed)
5 stitches removed from middle toe of right foot. Recommend leaving OTA and not using bacitracin to help wound heal.  Patient given Bactrim x 10 days due to stiches being in for 20 days. Do not submerge in water, recommend washing with soap and water.

## 2021-06-17 DIAGNOSIS — Z09 Encounter for follow-up examination after completed treatment for conditions other than malignant neoplasm: Secondary | ICD-10-CM | POA: Insufficient documentation

## 2021-06-17 DIAGNOSIS — Z5189 Encounter for other specified aftercare: Secondary | ICD-10-CM | POA: Insufficient documentation

## 2021-06-17 DIAGNOSIS — S91114A Laceration without foreign body of right lesser toe(s) without damage to nail, initial encounter: Secondary | ICD-10-CM | POA: Insufficient documentation

## 2021-10-28 LAB — HM MAMMOGRAPHY

## 2021-11-03 LAB — HM PAP SMEAR

## 2021-11-18 ENCOUNTER — Other Ambulatory Visit: Payer: Self-pay | Admitting: Nurse Practitioner

## 2021-11-18 MED ORDER — ESCITALOPRAM OXALATE 20 MG PO TABS: 20.0000 mg | ORAL_TABLET | Freq: Every day | ORAL | 0 refills | Status: DC

## 2021-11-18 NOTE — Addendum Note (Signed)
Addended by: Georgina Peer on: 11/18/2021 03:56 PM ? ? Modules accepted: Orders ? ?

## 2021-11-18 NOTE — Telephone Encounter (Signed)
Pt called in to follow up on refill request. Assisted pt with scheduling her medication follow up appt. Pt would like to know if her Rx could be sent in?  ? ?Please assist further.  ?

## 2021-11-18 NOTE — Telephone Encounter (Signed)
Pt needs appt. Left VM to call back to schedule ?

## 2021-11-18 NOTE — Telephone Encounter (Signed)
Patient requesting enough medication to get to her upcoming appointment.  ?

## 2021-11-18 NOTE — Telephone Encounter (Signed)
Requested medication (s) are due for refill today: yes ? ?Requested medication (s) are on the active medication list: yes ? ?Last refill:  03/01/21 #90 with 1 RF ? ?Future visit scheduled: no ? ?Notes to clinic:  Last visit that addressed this med/dx was 10/23/20, no upcoming visit, please asssess. ? ? ?  ? ?Requested Prescriptions  ?Pending Prescriptions Disp Refills  ? escitalopram (LEXAPRO) 20 MG tablet [Pharmacy Med Name: ESCITALOPRAM '20MG'$  TABLETS] 90 tablet 1  ?  Sig: TAKE 1 TABLET(20 MG) BY MOUTH DAILY  ?  ? Psychiatry:  Antidepressants - SSRI Failed - 11/18/2021  3:19 AM  ?  ?  Failed - Valid encounter within last 6 months  ?  Recent Outpatient Visits   ? ?      ? 7 months ago Visit for suture removal  ? [facility] Jon Bill[place], NP  ? 7 months ago Laceration of lesser toe of right foot without foreign body present or damage to nail, subsequent encounter  ? [place], [place]  ? 11 months ago Rash  ? [place], [place] T, NP  ? 1 year ago Moderate episode of recurrent major depressive disorder ([place])  ? [place], [place] T, NP  ? 1 year ago Moderate episode of recurrent major depressive disorder ([place])  ? [place], [place], [place]  ? ?  ?  ? ?  ?  ?  Passed - Completed PHQ-2 or PHQ-9 in the last 360 days  ?  ?  ? ? ?

## 2021-11-24 ENCOUNTER — Encounter: Payer: Self-pay | Admitting: Nurse Practitioner

## 2021-11-24 ENCOUNTER — Telehealth (INDEPENDENT_AMBULATORY_CARE_PROVIDER_SITE_OTHER): Payer: Managed Care, Other (non HMO) | Admitting: Nurse Practitioner

## 2021-11-24 DIAGNOSIS — F419 Anxiety disorder, unspecified: Secondary | ICD-10-CM

## 2021-11-24 DIAGNOSIS — E782 Mixed hyperlipidemia: Secondary | ICD-10-CM

## 2021-11-24 DIAGNOSIS — F331 Major depressive disorder, recurrent, moderate: Secondary | ICD-10-CM

## 2021-11-24 MED ORDER — ESCITALOPRAM OXALATE 20 MG PO TABS: 20.0000 mg | ORAL_TABLET | Freq: Every day | ORAL | 0 refills | Status: DC

## 2021-11-24 NOTE — Progress Notes (Signed)
? ?There were no vitals taken for this visit.  ? ?Subjective:  ? ? Patient ID: Virginia Austin, female    DOB: 1965-12-06, 56 y.o.   MRN: [id number] ? ?HPI: ?Virginia Austin is a 56 y.o. female ? ?Chief Complaint  ?Patient presents with  ? Anxiety  ? Depression  ? ?DEPRESSION/ANXIETY ?Patient seen today via telehealth visit for follow up on her anxiety and depression.  Patient states she is doing well with the current dose of Lexapro.  She denies concerns at visit today.  Denies SI. ? ?Flowsheet Row Video Visit from 11/24/2021 in [place]  ?PHQ-9 Total Score 0  ? ?  ? ?GAD 7 : Generalized Anxiety Score 11/24/2021 04/22/2020 10/16/2019 04/10/2019  ?Nervous, Anxious, on Edge 0 0 0 0  ?Control/stop worrying 0 0 0 0  ?Worry too much - different things 0 0 0 0  ?Trouble relaxing 1 0 1 0  ?Restless 0 0 0 0  ?Easily annoyed or irritable '1 1 1 '$ 0  ?Afraid - awful might happen 0 0 0 0  ?Total GAD 7 Score '2 1 2 '$ 0  ?Anxiety Difficulty Not difficult at all - - -  ? ? ? ? ? ?Relevant past medical, surgical, family and social history reviewed and updated as indicated. Interim medical history since our last visit reviewed. ?Allergies and medications reviewed and updated. ? ?Review of Systems  ?Psychiatric/Behavioral:  Negative for dysphoric mood and suicidal ideas. The patient is not nervous/anxious.   ? ?Per HPI unless specifically indicated above ? ?   ?Objective:  ?  ?There were no vitals taken for this visit.  ?Wt Readings from Last 3 Encounters:  ?04/08/21 176 lb (79.8 kg)  ?03/31/21 178 lb 3.2 oz (80.8 kg)  ?12/07/20 177 lb (80.3 kg)  ?  ?Physical Exam ?Vitals and nursing note reviewed.  ?Constitutional:   ?   General: She is not in acute distress. ?   Appearance: She is not ill-appearing.  ?HENT:  ?   Head: Normocephalic.  ?   Right Ear: Hearing normal.  ?   Left Ear: Hearing normal.  ?   Nose: Nose normal.  ?Pulmonary:  ?   Effort: Pulmonary effort is normal. No respiratory distress.  ?Neurological:  ?   Mental Status:  She is alert.  ?Psychiatric:     ?   Mood and Affect: Mood normal.     ?   Behavior: Behavior normal.     ?   Thought Content: Thought content normal.     ?   Judgment: Judgment normal.  ? ? ?Results for orders placed or performed in visit on 11/02/21  ?HM MAMMOGRAPHY  ?Result Value Ref Range  ? HM Mammogram 0-4 Bi-Rad 0-4 Bi-Rad, Self Reported Normal  ? ?   ?Assessment & Plan:  ? ?Problem List Items Addressed This Visit   ? ?  ? Other  ? Depression - Primary  ?  Chronic.  Controlled.  Continue with current medication regimen of Lexapro '20mg'$  daily.  Refill sent for 2 months.  Discussed with patient that she will need to come in for an appointment and have lab work done for further refills.  Return to clinic in 2 months for reevaluation.  Call sooner if concerns arise.  ? ?  ?  ? Relevant Medications  ? escitalopram (LEXAPRO) 20 MG tablet  ? Anxiety  ? Relevant Medications  ? escitalopram (LEXAPRO) 20 MG tablet  ?  ? ?Follow up plan: ?Return  in about 2 months (around 01/24/2022) for Physical and Fasting labs. ? ? ?This visit was completed via MyChart due to the restrictions of the COVID-19 pandemic. All issues as above were discussed and addressed. Physical exam was done as above through visual confirmation on MyChart. If it was felt that the patient should be evaluated in the office, they were directed there. The patient verbally consented to this visit. ?Location of the patient: Home ?Location of the provider: Office ?Those involved with this call:  ?Provider: Jon Billings, NP ?CMA: Yvonna Alanis, CMA ?Front Desk/Registration: Lynnell Catalan ?This encounter was conducted via video.  I spent 20 dedicated to the care of this patient on the date of this encounter to include previsit review of symptoms, plan of care and when to follow up, face to face time with the patient, and post visit ordering of testing.  ? ? ? ? ?

## 2021-11-25 NOTE — Assessment & Plan Note (Signed)
Chronic.  Controlled.  Continue with current medication regimen of Lexapro '20mg'$  daily.  Refill sent for 2 months.  Discussed with patient that she will need to come in for an appointment and have lab work done for further refills.  Return to clinic in 2 months for reevaluation.  Call sooner if concerns arise.  ? ?

## 2022-01-10 ENCOUNTER — Other Ambulatory Visit: Payer: Self-pay | Admitting: Nurse Practitioner

## 2022-01-11 NOTE — Telephone Encounter (Signed)
Requested Prescriptions  ?Pending Prescriptions Disp Refills  ?? escitalopram (LEXAPRO) 20 MG tablet [Pharmacy Med Name: ESCITALOPRAM '20MG'$  TABLETS] 60 tablet 1  ?  Sig: TAKE 1 TABLET(20 MG) BY MOUTH DAILY  ?  ? Psychiatry:  Antidepressants - SSRI Passed - 01/10/2022  3:20 AM  ?  ?  Passed - Completed PHQ-2 or PHQ-9 in the last 360 days  ?  ?  Passed - Valid encounter within last 6 months  ?  Recent Outpatient Visits   ?      ? 1 month ago Moderate episode of recurrent major depressive disorder ([place])  ? [place], NP  ? 9 months ago Visit for suture removal  ? [facility] Jon Billings, NP  ? 9 months ago Laceration of lesser toe of right foot without foreign body present or damage to nail, subsequent encounter  ? [facility] Vigg, Avanti, MD  ? 1 year ago Rash  ? [place], [place] T, NP  ? 1 year ago Moderate episode of recurrent major depressive disorder ([place])  ? [facility] McNabb, Henrine Screws T, NP  ?  ?  ? ?  ?  ?  ? ? ?

## 2022-03-12 ENCOUNTER — Other Ambulatory Visit: Payer: Self-pay | Admitting: Nurse Practitioner

## 2022-03-14 ENCOUNTER — Encounter: Payer: Self-pay | Admitting: *Deleted

## 2022-03-14 NOTE — Telephone Encounter (Signed)
Pt has newer rx 01/11/22, please fill that one. Requested Prescriptions  Pending Prescriptions Disp Refills  . escitalopram (LEXAPRO) 20 MG tablet [Pharmacy Med Name: ESCITALOPRAM '20MG'$  TABLETS] 60 tablet 1    Sig: TAKE 1 TABLET(20 MG) BY MOUTH DAILY     Psychiatry:  Antidepressants - SSRI Passed - 03/12/2022  3:17 AM      Passed - Completed PHQ-2 or PHQ-9 in the last 360 days      Passed - Valid encounter within last 6 months    Recent Outpatient Visits          3 months ago Moderate episode of recurrent major depressive disorder ([place])   [place], Karen, NP   11 months ago Visit for suture removal   [place], NP   11 months ago Laceration of lesser toe of right foot without foreign body present or damage to nail, subsequent encounter   [facility] Vigg, Avanti, MD   1 year ago [place], Bussey T, NP   1 year ago Moderate episode of recurrent major depressive disorder [facility])   [place], Barbaraann Faster, NP

## 2022-05-04 ENCOUNTER — Ambulatory Visit: Payer: Managed Care, Other (non HMO) | Admitting: Nurse Practitioner

## 2022-05-04 ENCOUNTER — Encounter: Payer: Self-pay | Admitting: Nurse Practitioner

## 2022-05-04 VITALS — BP 117/77 | HR 66 | Temp 98.7°F | Wt 177.4 lb

## 2022-05-04 DIAGNOSIS — M545 Low back pain, unspecified: Secondary | ICD-10-CM | POA: Diagnosis not present

## 2022-05-04 DIAGNOSIS — N3000 Acute cystitis without hematuria: Secondary | ICD-10-CM | POA: Diagnosis not present

## 2022-05-04 LAB — URINALYSIS, ROUTINE W REFLEX MICROSCOPIC
Bilirubin, UA: NEGATIVE
Glucose, UA: NEGATIVE
Ketones, UA: NEGATIVE
Nitrite, UA: NEGATIVE
Protein,UA: NEGATIVE
Specific Gravity, UA: 1.01 (ref 1.005–1.030)
Urobilinogen, Ur: 0.2 mg/dL (ref 0.2–1.0)
pH, UA: 5.5 (ref 5.0–7.5)

## 2022-05-04 LAB — MICROSCOPIC EXAMINATION
Bacteria, UA: NONE SEEN
Epithelial Cells (non renal): NONE SEEN /hpf (ref 0–10)

## 2022-05-04 MED ORDER — NITROFURANTOIN MONOHYD MACRO 100 MG PO CAPS: 100.0000 mg | ORAL_CAPSULE | Freq: Two times a day (BID) | ORAL | 0 refills | Status: DC

## 2022-05-04 NOTE — Assessment & Plan Note (Signed)
Urinalysis completed in house, + leuk, + RBC- sending for culture. Pt being treated for suspected urinary tract infection based on HPI, ROS and PE.    Prescribed macrobid '100mg'$  BID for 5 days. Pt educated on need to contact clinic for worsening or unrelieved symptoms.

## 2022-05-04 NOTE — Progress Notes (Signed)
BP 117/77   Pulse 66   Temp 98.7 F (37.1 C) (Oral)   Wt 177 lb 6.4 oz (80.5 kg)   SpO2 97%   BMI 25.37 kg/m    Subjective:    Patient ID: Virginia Austin, female    DOB: Mar 12, 1966, 56 y.o.   MRN: [id number]  HPI: Virginia Austin is a 56 y.o. female  Chief Complaint  Patient presents with   Back Pain    L Lower back pain radiating towards LLQ. Patient reports feeling as if she cannot empty her bladder all the way through. Denies dysuria.    BACK PAIN Duration: days, since Saturday Mechanism of injury: no injury Location: LLQ radiating to left flank/lower back Onset: gradual Severity: 5/10 while standing, 2/10 while sitting Quality: sharp pain that dulls Frequency: intermittent Radiation: towards back Aggravating factors: none, lifting, movement, walking, laying, bending, prolonged sitting, coughing, valsalva, and Pain increased with coughing/valsalva Alleviating factors: nothing Status: worse Treatments attempted:  hydration, cranberry juice, rest, APAP, and ibuprofen  Relief with NSAIDs?: no Nighttime pain:  yes Paresthesias / decreased sensation:  no Bowel / bladder incontinence:  no Fevers:  no Dysuria / urinary frequency:  no  Relevant past medical, surgical, family and social history reviewed and updated as indicated. Interim medical history since our last visit reviewed. Allergies and medications reviewed and updated.  Review of Systems  Genitourinary:  Positive for difficulty urinating, flank pain, frequency and urgency. Negative for decreased urine volume, dysuria, hematuria and pelvic pain.    Per HPI unless specifically indicated above     Objective:    BP 117/77   Pulse 66   Temp 98.7 F (37.1 C) (Oral)   Wt 177 lb 6.4 oz (80.5 kg)   SpO2 97%   BMI 25.37 kg/m   Wt Readings from Last 3 Encounters:  05/04/22 177 lb 6.4 oz (80.5 kg)  04/08/21 176 lb (79.8 kg)  03/31/21 178 lb 3.2 oz (80.8 kg)    Physical Exam Constitutional:      Appearance:  Normal appearance. She is normal weight.  HENT:     Head: Normocephalic.  Cardiovascular:     Rate and Rhythm: Normal rate and regular rhythm.     Pulses: Normal pulses.  Pulmonary:     Effort: Pulmonary effort is normal.     Breath sounds: Normal breath sounds.  Abdominal:     General: Abdomen is flat. Bowel sounds are normal.     Palpations: Abdomen is soft.     Tenderness: There is no abdominal tenderness. There is no right CVA tenderness, left CVA tenderness or rebound.  Neurological:     General: No focal deficit present.     Mental Status: She is alert and oriented to person, place, and time.  Psychiatric:        Mood and Affect: Mood normal.     Results for orders placed or performed in visit on 05/04/22  Microscopic Examination   Urine  Result Value Ref Range   WBC, UA 0-5 0 - 5 /hpf   RBC, Urine 0-2 0 - 2 /hpf   Epithelial Cells (non renal) None seen 0 - 10 /hpf   Bacteria, UA None seen None seen/Few  Urinalysis, Routine w reflex microscopic  Result Value Ref Range   Specific Gravity, UA 1.010 1.005 - 1.030   pH, UA 5.5 5.0 - 7.5   Color, UA Yellow Yellow   Appearance Ur Clear Clear   Leukocytes,UA 1+ (A) Negative  Protein,UA Negative Negative/Trace   Glucose, UA Negative Negative   Ketones, UA Negative Negative   RBC, UA Trace (A) Negative   Bilirubin, UA Negative Negative   Urobilinogen, Ur 0.2 0.2 - 1.0 mg/dL   Nitrite, UA Negative Negative   Microscopic Examination See below:       Assessment & Plan:   Problem List Items Addressed This Visit       Genitourinary   Acute cystitis without hematuria - Primary    Urinalysis completed in house, + leuk, + RBC- sending for culture. Pt being treated for suspected urinary tract infection based on HPI, ROS and PE.    Prescribed macrobid '100mg'$  BID for 5 days. Pt educated on need to contact clinic for worsening or unrelieved symptoms.       Relevant Orders   Urine Culture   Other Visit Diagnoses      Acute low back pain without sciatica, unspecified back pain laterality       Relevant Orders   Urinalysis, Routine w reflex microscopic (Completed)        Follow up plan: Return if symptoms worsen or fail to improve, for Urinary tract infection treatment.

## 2022-05-05 NOTE — Progress Notes (Signed)
Results discussed with patient during visit today.

## 2022-07-11 NOTE — Progress Notes (Unsigned)
   There were no vitals taken for this visit.   Subjective:    Patient ID: Virginia Austin, female    DOB: 16-Aug-1966, 56 y.o.   MRN: [id number]  HPI: Virginia Austin is a 56 y.o. female  No chief complaint on file.  UPPER RESPIRATORY TRACT INFECTION Worst symptom: Fever: {Blank single:19197::"yes","no"} Cough: {Blank single:19197::"yes","no"} Shortness of breath: {Blank single:19197::"yes","no"} Wheezing: {Blank single:19197::"yes","no"} Chest pain: {Blank single:19197::"yes","no","yes, with cough"} Chest tightness: {Blank single:19197::"yes","no"} Chest congestion: {Blank single:19197::"yes","no"} Nasal congestion: {Blank single:19197::"yes","no"} Runny nose: {Blank single:19197::"yes","no"} Post nasal drip: {Blank single:19197::"yes","no"} Sneezing: {Blank single:19197::"yes","no"} Sore throat: {Blank single:19197::"yes","no"} Swollen glands: {Blank single:19197::"yes","no"} Sinus pressure: {Blank single:19197::"yes","no"} Headache: {Blank single:19197::"yes","no"} Face pain: {Blank single:19197::"yes","no"} Toothache: {Blank single:19197::"yes","no"} Ear pain: {Blank single:19197::"yes","no"} {Blank single:19197::""right","left", "bilateral"} Ear pressure: {Blank single:19197::"yes","no"} {Blank single:19197::""right","left", "bilateral"} Eyes red/itching:{Blank single:19197::"yes","no"} Eye drainage/crusting: {Blank single:19197::"yes","no"}  Vomiting: {Blank single:19197::"yes","no"} Rash: {Blank single:19197::"yes","no"} Fatigue: {Blank single:19197::"yes","no"} Sick contacts: {Blank single:19197::"yes","no"} Strep contacts: {Blank single:19197::"yes","no"}  Context: {Blank multiple:19196::"better","worse","stable","fluctuating"} Recurrent sinusitis: {Blank single:19197::"yes","no"} Relief with OTC cold/cough medications: {Blank single:19197::"yes","no"}  Treatments attempted: {Blank multiple:19196::"none","cold/sinus","mucinex","anti-histamine","pseudoephedrine","cough  syrup","antibiotics"}   Relevant past medical, surgical, family and social history reviewed and updated as indicated. Interim medical history since our last visit reviewed. Allergies and medications reviewed and updated.  Review of Systems  Per HPI unless specifically indicated above     Objective:    There were no vitals taken for this visit.  Wt Readings from Last 3 Encounters:  05/04/22 177 lb 6.4 oz (80.5 kg)  04/08/21 176 lb (79.8 kg)  03/31/21 178 lb 3.2 oz (80.8 kg)    Physical Exam  Results for orders placed or performed in visit on 05/04/22  Microscopic Examination   Urine  Result Value Ref Range   WBC, UA 0-5 0 - 5 /hpf   RBC, Urine 0-2 0 - 2 /hpf   Epithelial Cells (non renal) None seen 0 - 10 /hpf   Bacteria, UA None seen None seen/Few  Urinalysis, Routine w reflex microscopic  Result Value Ref Range   Specific Gravity, UA 1.010 1.005 - 1.030   pH, UA 5.5 5.0 - 7.5   Color, UA Yellow Yellow   Appearance Ur Clear Clear   Leukocytes,UA 1+ (A) Negative   Protein,UA Negative Negative/Trace   Glucose, UA Negative Negative   Ketones, UA Negative Negative   RBC, UA Trace (A) Negative   Bilirubin, UA Negative Negative   Urobilinogen, Ur 0.2 0.2 - 1.0 mg/dL   Nitrite, UA Negative Negative   Microscopic Examination See below:       Assessment & Plan:   Problem List Items Addressed This Visit   None    Follow up plan: No follow-ups on file.  This visit was completed via MyChart due to the restrictions of the COVID-19 pandemic. All issues as above were discussed and addressed. Physical exam was done as above through visual confirmation on MyChart. If it was felt that the patient should be evaluated in the office, they were directed there. The patient verbally consented to this visit. Location of the patient: *** Location of the provider: Office Those involved with this call:  Provider: Jon Billings, NP CMA: *** Front Desk/Registration: *** This  encounter was conducted via ***.  I spent *** dedicated to the care of this patient on the date of this encounter to include previsit review of ***, face to face time with the patient, and post visit ordering of testing.

## 2022-07-12 ENCOUNTER — Telehealth (INDEPENDENT_AMBULATORY_CARE_PROVIDER_SITE_OTHER): Payer: Managed Care, Other (non HMO) | Admitting: Nurse Practitioner

## 2022-07-12 ENCOUNTER — Encounter: Payer: Self-pay | Admitting: Nurse Practitioner

## 2022-07-12 ENCOUNTER — Telehealth: Payer: Self-pay

## 2022-07-12 ENCOUNTER — Telehealth: Payer: Self-pay | Admitting: Nurse Practitioner

## 2022-07-12 DIAGNOSIS — J0110 Acute frontal sinusitis, unspecified: Secondary | ICD-10-CM | POA: Diagnosis not present

## 2022-07-12 MED ORDER — DOXYCYCLINE HYCLATE 100 MG PO TABS: 100.0000 mg | ORAL_TABLET | Freq: Two times a day (BID) | ORAL | 0 refills | Status: DC

## 2022-07-12 NOTE — Telephone Encounter (Signed)
LVM asking patient to call back to schedule a physical

## 2022-07-12 NOTE — Telephone Encounter (Signed)
Copied from [place] [id number]. Topic: General - Other >> Jul 12, 2022  1:03 PM Ja-Kwan M wrote: Reason for CRM: Pt stated her call with Santiago Glad got disconnected prior to her informing her of where to send her Rx. Pt requests that the Rx be sent to [place], [place]  Phone: [phone]  Fax: [phone]

## 2022-07-12 NOTE — Telephone Encounter (Signed)
Attempted to call patient to schedule her annual physical. LVMTRC

## 2022-07-12 NOTE — Telephone Encounter (Signed)
I sent patient's medication already.  She is due for a follow up, please get her scheduled.

## 2022-07-15 ENCOUNTER — Telehealth: Payer: Self-pay

## 2022-07-15 NOTE — Telephone Encounter (Signed)
Medication refill request for Lexapro 20MG. Lst filled 05/13/2022 #60. Office has attempted 2 x to call and set up a f/up appointment for her physical, patient has not returned VM. Plase advise.

## 2022-07-21 MED ORDER — ESCITALOPRAM OXALATE 20 MG PO TABS
ORAL_TABLET | ORAL | 0 refills | Status: DC
Start: 2022-07-21 — End: 2022-07-29

## 2022-07-21 NOTE — Telephone Encounter (Signed)
Pt called in to follow up on request. Pt says that she is almost out and would like to have medication sent in as soon as possible. Scheduled pt for next available 11/17. Pt would like to know if provider could send in Rx until her appt?   Pt fear running out of her medication.   Pharmacy:  [facility] [place], [place] AT [facility] [address] [facility] Phone: [phone]  Fax: [phone]

## 2022-07-21 NOTE — Telephone Encounter (Signed)
Medication sent to the pharmacy.

## 2022-07-21 NOTE — Telephone Encounter (Signed)
Patient notified via mychart messages.

## 2022-07-21 NOTE — Addendum Note (Signed)
Addended by: Jon Billings on: 07/21/2022 11:14 AM   Modules accepted: Orders

## 2022-07-21 NOTE — Telephone Encounter (Signed)
Pt requesting courtesy refill until she sees you on 07/29/2022. Please advise.

## 2022-07-28 NOTE — Progress Notes (Signed)
There were no vitals taken for this visit.   Subjective:    Patient ID: Virginia Austin, female    DOB: 12/29/1965, 56 y.o.   MRN: [id number]  HPI: Virginia Austin is a 56 y.o. female  Chief Complaint  Patient presents with   Medication Refill        MOOD Patient states she feels like her mood has been better with the medication.  She feels like the lexapro '20mg'$  is working well for her.  Denies concerns regarding medication at visit today.  Denies SI.  Needs a refill.  Flowsheet Row Video Visit from 07/29/2022 in [address]  PHQ-9 Total Score 0         Relevant past medical, surgical, family and social history reviewed and updated as indicated. Interim medical history since our last visit reviewed. Allergies and medications reviewed and updated.  Review of Systems  Psychiatric/Behavioral:  Positive for dysphoric mood. Negative for suicidal ideas. The patient is nervous/anxious.     Per HPI unless specifically indicated above     Objective:    There were no vitals taken for this visit.  Wt Readings from Last 3 Encounters:  05/04/22 177 lb 6.4 oz (80.5 kg)  04/08/21 176 lb (79.8 kg)  03/31/21 178 lb 3.2 oz (80.8 kg)    Physical Exam Vitals and nursing note reviewed.  HENT:     Head: Normocephalic.     Right Ear: Hearing normal.     Left Ear: Hearing normal.     Nose: Nose normal.  Eyes:     Pupils: Pupils are equal, round, and reactive to light.  Pulmonary:     Effort: Pulmonary effort is normal. No respiratory distress.  Neurological:     Mental Status: She is alert.  Psychiatric:        Mood and Affect: Mood normal.        Behavior: Behavior normal.        Thought Content: Thought content normal.        Judgment: Judgment normal.     Results for orders placed or performed in visit on 05/04/22  Microscopic Examination   Urine  Result Value Ref Range   WBC, UA 0-5 0 - 5 /hpf   RBC, Urine 0-2 0 - 2 /hpf   Epithelial Cells (non renal) None  seen 0 - 10 /hpf   Bacteria, UA None seen None seen/Few  Urinalysis, Routine w reflex microscopic  Result Value Ref Range   Specific Gravity, UA 1.010 1.005 - 1.030   pH, UA 5.5 5.0 - 7.5   Color, UA Yellow Yellow   Appearance Ur Clear Clear   Leukocytes,UA 1+ (A) Negative   Protein,UA Negative Negative/Trace   Glucose, UA Negative Negative   Ketones, UA Negative Negative   RBC, UA Trace (A) Negative   Bilirubin, UA Negative Negative   Urobilinogen, Ur 0.2 0.2 - 1.0 mg/dL   Nitrite, UA Negative Negative   Microscopic Examination See below:       Assessment & Plan:   Problem List Items Addressed This Visit       Other   Depression - Primary    Chronic.  Controlled.  Continue with current medication regimen of Lexapro '20mg'$ .  Refill sent for 30 days for patient to make an in person appt.  Labs ordered today.  Return to clinic in 6 months for reevaluation.  Call sooner if concerns arise.       Relevant Medications  escitalopram (LEXAPRO) 20 MG tablet   Anxiety    Chronic.  Controlled.  Continue with current medication regimen of Lexapro '20mg'$ .  Refill sent for 30 days for patient to make an in person appt.  Labs ordered today.  Return to clinic in 6 months for reevaluation.  Call sooner if concerns arise.        Relevant Medications   escitalopram (LEXAPRO) 20 MG tablet     Follow up plan: Return in about 1 month (around 08/28/2022) for Physical and Fasting labs.  This visit was completed via MyChart due to the restrictions of the COVID-19 pandemic. All issues as above were discussed and addressed. Physical exam was done as above through visual confirmation on MyChart. If it was felt that the patient should be evaluated in the office, they were directed there. The patient verbally consented to this visit. Location of the patient: Home Location of the provider: Office Those involved with this call:  Provider: Jon Billings, NP CMA: Valinda Hoar. CMA Front  Desk/Registration: Lynnell Catalan This encounter was conducted via video.  I spent 20 dedicated to the care of this patient on the date of this encounter to include previsit review of medications, plan of care, and follow up, face to face time with the patient, and post visit ordering of testing.

## 2022-07-29 ENCOUNTER — Telehealth (INDEPENDENT_AMBULATORY_CARE_PROVIDER_SITE_OTHER): Payer: Managed Care, Other (non HMO) | Admitting: Nurse Practitioner

## 2022-07-29 ENCOUNTER — Encounter: Payer: Self-pay | Admitting: Nurse Practitioner

## 2022-07-29 ENCOUNTER — Ambulatory Visit: Payer: Managed Care, Other (non HMO) | Admitting: Nurse Practitioner

## 2022-07-29 DIAGNOSIS — F331 Major depressive disorder, recurrent, moderate: Secondary | ICD-10-CM

## 2022-07-29 DIAGNOSIS — F419 Anxiety disorder, unspecified: Secondary | ICD-10-CM

## 2022-07-29 MED ORDER — ESCITALOPRAM OXALATE 20 MG PO TABS
ORAL_TABLET | ORAL | 0 refills | Status: DC
Start: 2022-07-29 — End: 2022-09-01

## 2022-07-29 NOTE — Assessment & Plan Note (Signed)
Chronic.  Controlled.  Continue with current medication regimen of Lexapro '20mg'$ .  Refill sent for 30 days for patient to make an in person appt.  Labs ordered today.  Return to clinic in 6 months for reevaluation.  Call sooner if concerns arise.

## 2022-08-31 NOTE — Progress Notes (Signed)
There were no vitals taken for this visit.   Subjective:    Patient ID: Virginia Austin, female    DOB: [birth year], 56 y.o.   MRN: [id number]  HPI: Virginia Austin is a 56 y.o. female presenting on 09/01/2022 for comprehensive medical examination. Current medical complaints include:{Blank single:19197::"none","***"}  She currently lives with: Menopausal Symptoms: {Blank single:19197::"yes","no"}  HYPERLIPIDEMIA Hyperlipidemia status: {Blank single:19197::"excellent compliance","good compliance","fair compliance","poor compliance"} Satisfied with current treatment?  {Blank single:19197::"yes","no"} Side effects:  {Blank single:19197::"yes","no"} Medication compliance: {Blank single:19197::"excellent compliance","good compliance","fair compliance","poor compliance"} Past cholesterol meds: {Blank multiple:19196::"none","atorvastain (lipitor)","lovastatin (mevacor)","pravastatin (pravachol)","rosuvastatin (crestor)","simvastatin (zocor)","vytorin","fenofibrate (tricor)","gemfibrozil","ezetimide (zetia)","niaspan","lovaza"} Supplements: {Blank multiple:19196::"none","fish oil","niacin","red yeast rice"} Aspirin:  {Blank single:19197::"yes","no"} The 10-year ASCVD risk score (Arnett DK, et al., 2019) is: 2%   Values used to calculate the score:     Age: 8 years     Sex: Female     Is Non-Hispanic African American: No     Diabetic: No     Tobacco smoker: No     Systolic Blood Pressure: 720 mmHg     Is BP treated: No     HDL Cholesterol: 57 mg/dL     Total Cholesterol: 207 mg/dL Chest pain:  {Blank single:19197::"yes","no"} Coronary artery disease:  {Blank single:19197::"yes","no"} Family history CAD:  {Blank single:19197::"yes","no"} Family history early CAD:  {Blank single:19197::"yes","no"}  MOOD   Depression Screen done today and results listed below:     07/29/2022    9:42 AM 07/12/2022   12:53 PM 11/24/2021    4:04 PM 03/31/2021    1:24 PM 10/23/2020    9:41 AM  Depression  screen PHQ 2/9  Decreased Interest 0 0 0 0 0  Down, Depressed, Hopeless 0 0 0 0 0  PHQ - 2 Score 0 0 0 0 0  Altered sleeping 0 0 0 0 0  Tired, decreased energy 0 0 0 0 1  Change in appetite 0 0 0 0 0  Feeling bad or failure about yourself  0 0 0 0 0  Trouble concentrating 0 0 0 0 0  Moving slowly or fidgety/restless 0 0 0 0 0  Suicidal thoughts 0 0 0 0 0  PHQ-9 Score 0 0 0 0 1  Difficult doing work/chores Not difficult at all Not difficult at all Not difficult at all      The patient {has/does not have:19849} a history of falls. I {did/did not:19850} complete a risk assessment for falls. A plan of care for falls {was/was not:19852} documented.   Past Medical History:  Past Medical History:  Diagnosis Date   Anxiety    Arthritis    knees   Chronic headaches    otc med prn   Depression    Dysthymic disorder    Hyperlipidemia    diet controlled, no meds   Impaired fasting glucose    Lumbago    Neck sprain and strain    Hx   Vitamin D deficiency    Hx - resolved per patient   Wears contact lenses     Surgical History:  Past Surgical History:  Procedure Laterality Date   COLONOSCOPY WITH PROPOFOL N/A 06/06/2019   Procedure: COLONOSCOPY WITH BIOPSY;  Surgeon: [name], MD;  Location: [facility];  Service: Endoscopy;  Laterality: N/A;   DILATATION & CURETTAGE/HYSTEROSCOPY WITH MYOSURE N/A 11/19/2015   Procedure: DILATATION & CURETTAGE/HYSTEROSCOPY WITH MYOSURE;  Surgeon: Brien Few, MD;  Location: [facility];  Service: Gynecology;  Laterality: N/A;   HERNIA REPAIR     POLYPECTOMY  N/A 06/06/2019   Procedure: POLYPECTOMY;  Surgeon: [name], MD;  Location: [facility];  Service: Endoscopy;  Laterality: N/A;   SKIN CANCER EXCISION     TONSILLECTOMY      Medications:  Current Outpatient Medications on File Prior to Visit  Medication Sig   escitalopram (LEXAPRO) 20 MG tablet TAKE 1 TABLET(20 MG) BY MOUTH DAILY   Multiple Vitamin (MULTIVITAMIN WITH  MINERALS) TABS tablet Take 1 tablet by mouth daily.   No current facility-administered medications on file prior to visit.    Allergies:  Allergies  Allergen Reactions   Amoxicillin Other (See Comments)    Yeast infection    Social History:  Social History   Socioeconomic History   Marital status: Married    Spouse name: Not on file   Number of children: Not on file   Years of education: Not on file   Highest education level: Not on file  Occupational History   Not on file  Tobacco Use   Smoking status: Never   Smokeless tobacco: Never  Vaping Use   Vaping Use: Never used  Substance and Sexual Activity   Alcohol use: Yes    Alcohol/week: 1.0 standard drink of alcohol    Types: 1 Cans of beer per week    Comment: occasional beer   Drug use: No   Sexual activity: Yes    Birth control/protection: Post-menopausal  Other Topics Concern   Not on file  Social History Narrative   Not on file   Social Determinants of Health   Financial Resource Strain: Not on file  Food Insecurity: Not on file  Transportation Needs: Not on file  Physical Activity: Not on file  Stress: Not on file  Social Connections: Not on file  Intimate Partner Violence: Not on file   Social History   Tobacco Use  Smoking Status Never  Smokeless Tobacco Never   Social History   Substance and Sexual Activity  Alcohol Use Yes   Alcohol/week: 1.0 standard drink of alcohol   Types: 1 Cans of beer per week   Comment: occasional beer    Family History:  Family History  Problem Relation Age of Onset   Diabetes Father    Hypertension Father    Hypertension Sister    Diabetes Paternal Grandmother    Hypertension Paternal Grandmother    Thyroid disease Paternal Grandmother     Past medical history, surgical history, medications, allergies, family history and social history reviewed with patient today and changes made to appropriate areas of the chart.   ROS All other ROS negative except  what is listed above and in the HPI.      Objective:    There were no vitals taken for this visit.  Wt Readings from Last 3 Encounters:  05/04/22 177 lb 6.4 oz (80.5 kg)  04/08/21 176 lb (79.8 kg)  03/31/21 178 lb 3.2 oz (80.8 kg)    Physical Exam  Results for orders placed or performed in visit on 05/04/22  Microscopic Examination   Urine  Result Value Ref Range   WBC, UA 0-5 0 - 5 /hpf   RBC, Urine 0-2 0 - 2 /hpf   Epithelial Cells (non renal) None seen 0 - 10 /hpf   Bacteria, UA None seen None seen/Few  Urinalysis, Routine w reflex microscopic  Result Value Ref Range   Specific Gravity, UA 1.010 1.005 - 1.030   pH, UA 5.5 5.0 - 7.5   Color, UA Yellow Yellow  Appearance Ur Clear Clear   Leukocytes,UA 1+ (A) Negative   Protein,UA Negative Negative/Trace   Glucose, UA Negative Negative   Ketones, UA Negative Negative   RBC, UA Trace (A) Negative   Bilirubin, UA Negative Negative   Urobilinogen, Ur 0.2 0.2 - 1.0 mg/dL   Nitrite, UA Negative Negative   Microscopic Examination See below:       Assessment & Plan:   Problem List Items Addressed This Visit       Other   Depression   Anxiety   Hyperlipidemia - Primary     Follow up plan: No follow-ups on file.   LABORATORY TESTING:  - Pap smear: {Blank DXIPJA:25053::"ZJQ done","not applicable","up to date","done elsewhere"}  IMMUNIZATIONS:   - Tdap: Tetanus vaccination status reviewed: {tetanus status:315746}. - Influenza: {Blank single:19197::"Up to date","Administered today","Postponed to flu season","Refused","Given elsewhere"} - Pneumovax: {Blank single:19197::"Up to date","Administered today","Not applicable","Refused","Given elsewhere"} - Prevnar: {Blank single:19197::"Up to date","Administered today","Not applicable","Refused","Given elsewhere"} - COVID: {Blank single:19197::"Up to date","Administered today","Not applicable","Refused","Given elsewhere"} - HPV: {Blank single:19197::"Up to  date","Administered today","Not applicable","Refused","Given elsewhere"} - Shingrix vaccine: {Blank single:19197::"Up to date","Administered today","Not applicable","Refused","Given elsewhere"}  SCREENING: -Mammogram: {Blank single:19197::"Up to date","Ordered today","Not applicable","Refused","Done elsewhere"}  - Colonoscopy: {Blank single:19197::"Up to date","Ordered today","Not applicable","Refused","Done elsewhere"}  - Bone Density: {Blank single:19197::"Up to date","Ordered today","Not applicable","Refused","Done elsewhere"}  -Hearing Test: {Blank single:19197::"Up to date","Ordered today","Not applicable","Refused","Done elsewhere"}  -Spirometry: {Blank single:19197::"Up to date","Ordered today","Not applicable","Refused","Done elsewhere"}   PATIENT COUNSELING:   Advised to take 1 mg of folate supplement per day if capable of pregnancy.   Sexuality: Discussed sexually transmitted diseases, partner selection, use of condoms, avoidance of unintended pregnancy  and contraceptive alternatives.   Advised to avoid cigarette smoking.  I discussed with the patient that most people either abstain from alcohol or drink within safe limits (<=14/week and <=4 drinks/occasion for males, <=7/weeks and <= 3 drinks/occasion for females) and that the risk for alcohol disorders and other health effects rises proportionally with the number of drinks per week and how often a drinker exceeds daily limits.  Discussed cessation/primary prevention of drug use and availability of treatment for abuse.   Diet: Encouraged to adjust caloric intake to maintain  or achieve ideal body weight, to reduce intake of dietary saturated fat and total fat, to limit sodium intake by avoiding high sodium foods and not adding table salt, and to maintain adequate dietary potassium and calcium preferably from fresh fruits, vegetables, and low-fat dairy products.    stressed the importance of regular exercise  Injury prevention:  Discussed safety belts, safety helmets, smoke detector, smoking near bedding or upholstery.   Dental health: Discussed importance of regular tooth brushing, flossing, and dental visits.    NEXT PREVENTATIVE PHYSICAL DUE IN 1 YEAR. No follow-ups on file.

## 2022-09-01 ENCOUNTER — Ambulatory Visit (INDEPENDENT_AMBULATORY_CARE_PROVIDER_SITE_OTHER): Payer: Managed Care, Other (non HMO) | Admitting: Nurse Practitioner

## 2022-09-01 ENCOUNTER — Encounter: Payer: Self-pay | Admitting: Nurse Practitioner

## 2022-09-01 VITALS — BP 100/64 | HR 79 | Temp 98.4°F | Ht 70.8 in | Wt 174.8 lb

## 2022-09-01 DIAGNOSIS — F331 Major depressive disorder, recurrent, moderate: Secondary | ICD-10-CM

## 2022-09-01 DIAGNOSIS — Z0000 Encounter for general adult medical examination without abnormal findings: Secondary | ICD-10-CM

## 2022-09-01 DIAGNOSIS — E782 Mixed hyperlipidemia: Secondary | ICD-10-CM

## 2022-09-01 DIAGNOSIS — F419 Anxiety disorder, unspecified: Secondary | ICD-10-CM

## 2022-09-01 LAB — URINALYSIS, ROUTINE W REFLEX MICROSCOPIC
Bilirubin, UA: NEGATIVE
Glucose, UA: NEGATIVE
Ketones, UA: NEGATIVE
Nitrite, UA: NEGATIVE
Protein,UA: NEGATIVE
RBC, UA: NEGATIVE
Specific Gravity, UA: 1.015 (ref 1.005–1.030)
Urobilinogen, Ur: 0.2 mg/dL (ref 0.2–1.0)
pH, UA: 7 (ref 5.0–7.5)

## 2022-09-01 LAB — MICROSCOPIC EXAMINATION: Bacteria, UA: NONE SEEN

## 2022-09-01 MED ORDER — CITALOPRAM HYDROBROMIDE 20 MG PO TABS: 20.0000 mg | ORAL_TABLET | Freq: Every day | ORAL | 0 refills | Status: DC

## 2022-09-01 NOTE — Assessment & Plan Note (Signed)
Chronic.  Controlled.  Continue with current medication regimen.  Labs ordered today.  Return to clinic in 6 months for reevaluation.  Call sooner if concerns arise.  ? ?

## 2022-09-01 NOTE — Assessment & Plan Note (Signed)
Chronic. Will change from Lexapro to Celexa.  Will start at Celexa at '20mg'$ .  Can increase if patient feels like it is needed.  Will follow up in 1 month.  Call sooner if concerns arise.

## 2022-09-02 LAB — CBC WITH DIFFERENTIAL/PLATELET
Basophils Absolute: 0.1 10*3/uL (ref 0.0–0.2)
Basos: 1 %
EOS (ABSOLUTE): 0.2 10*3/uL (ref 0.0–0.4)
Eos: 3 %
Hematocrit: 39.7 % (ref 34.0–46.6)
Hemoglobin: 13.1 g/dL (ref 11.1–15.9)
Immature Grans (Abs): 0 10*3/uL (ref 0.0–0.1)
Immature Granulocytes: 0 %
Lymphocytes Absolute: 3.3 10*3/uL — ABNORMAL HIGH (ref 0.7–3.1)
Lymphs: 43 %
MCH: 30.6 pg (ref 26.6–33.0)
MCHC: 33 g/dL (ref 31.5–35.7)
MCV: 93 fL (ref 79–97)
Monocytes Absolute: 0.7 10*3/uL (ref 0.1–0.9)
Monocytes: 9 %
Neutrophils Absolute: 3.4 10*3/uL (ref 1.4–7.0)
Neutrophils: 44 %
Platelets: 339 10*3/uL (ref 150–450)
RBC: 4.28 x10E6/uL (ref 3.77–5.28)
RDW: 13.1 % (ref 11.7–15.4)
WBC: 7.6 10*3/uL (ref 3.4–10.8)

## 2022-09-02 LAB — COMPREHENSIVE METABOLIC PANEL
ALT: 19 IU/L (ref 0–32)
AST: 18 IU/L (ref 0–40)
Albumin/Globulin Ratio: 1.8 (ref 1.2–2.2)
Albumin: 4.4 g/dL (ref 3.8–4.9)
Alkaline Phosphatase: 125 IU/L — ABNORMAL HIGH (ref 44–121)
BUN/Creatinine Ratio: 19 (ref 9–23)
BUN: 15 mg/dL (ref 6–24)
Bilirubin Total: 0.3 mg/dL (ref 0.0–1.2)
CO2: 23 mmol/L (ref 20–29)
Calcium: 9.9 mg/dL (ref 8.7–10.2)
Chloride: 101 mmol/L (ref 96–106)
Creatinine, Ser: 0.79 mg/dL (ref 0.57–1.00)
Globulin, Total: 2.4 g/dL (ref 1.5–4.5)
Glucose: 107 mg/dL — ABNORMAL HIGH (ref 70–99)
Potassium: 4.2 mmol/L (ref 3.5–5.2)
Sodium: 139 mmol/L (ref 134–144)
Total Protein: 6.8 g/dL (ref 6.0–8.5)
eGFR: 87 mL/min/{1.73_m2} (ref >59)

## 2022-09-02 LAB — LIPID PANEL
Chol/HDL Ratio: 4.2 ratio (ref 0.0–4.4)
Cholesterol, Total: 242 mg/dL — ABNORMAL HIGH (ref 100–199)
HDL: 58 mg/dL (ref >39)
LDL Chol Calc (NIH): 155 mg/dL — ABNORMAL HIGH (ref 0–99)
Triglycerides: 161 mg/dL — ABNORMAL HIGH (ref 0–149)
VLDL Cholesterol Cal: 29 mg/dL (ref 5–40)

## 2022-09-02 LAB — TSH: TSH: 2.06 u[IU]/mL (ref 0.450–4.500)

## 2022-10-10 ENCOUNTER — Ambulatory Visit (INDEPENDENT_AMBULATORY_CARE_PROVIDER_SITE_OTHER): Payer: Commercial Managed Care - PPO | Admitting: Nurse Practitioner

## 2022-10-10 ENCOUNTER — Encounter: Payer: Self-pay | Admitting: Nurse Practitioner

## 2022-10-10 VITALS — BP 98/62 | HR 79 | Temp 98.7°F | Wt 177.4 lb

## 2022-10-10 DIAGNOSIS — F331 Major depressive disorder, recurrent, moderate: Secondary | ICD-10-CM

## 2022-10-10 DIAGNOSIS — F419 Anxiety disorder, unspecified: Secondary | ICD-10-CM

## 2022-10-10 MED ORDER — CITALOPRAM HYDROBROMIDE 40 MG PO TABS: 40.0000 mg | ORAL_TABLET | Freq: Every day | ORAL | 1 refills | Status: DC

## 2022-10-10 NOTE — Assessment & Plan Note (Signed)
Chronic. Improved with Celexa.  Will increase from '20mg'$  to '40mg'$ .  Follow up in 5 months.  Call sooner if concerns arise.

## 2022-10-10 NOTE — Progress Notes (Signed)
BP 98/62   Pulse 79   Temp 98.7 F (37.1 C) (Oral)   Wt 177 lb 6.4 oz (80.5 kg)   SpO2 97%   BMI 24.88 kg/m    Subjective:    Patient ID: Virginia Austin, female    DOB: 07-16-1966, 57 y.o.   MRN: [id number]  HPI: Virginia Austin is a 57 y.o. female  Chief Complaint  Patient presents with   Depression   Anxiety   DEPRESSION/ANXIETY Patient states she likes the change from Lexapro to Celexa.  She was wondering if the medication can be increased. There are days when she still feels irritated.   Overall her mood seems to be better.  Denies SI.   [facility] Office Visit from 10/10/2022 in [facility]  PHQ-9 Total Score 0         10/10/2022    8:49 AM 09/01/2022    8:32 AM 07/29/2022    9:42 AM 07/12/2022   12:54 PM  GAD 7 : Generalized Anxiety Score  Nervous, Anxious, on Edge 0 0 0 0  Control/stop worrying 0 0 0 0  Worry too much - different things 0 0 0 0  Trouble relaxing 0 0 0 1  Restless 0 0 0 0  Easily annoyed or irritable 0 1 0 0  Afraid - awful might happen 0 0 0 0  Total GAD 7 Score 0 1 0 1  Anxiety Difficulty Not difficult at all Not difficult at all Not difficult at all Not difficult at all     Relevant past medical, surgical, family and social history reviewed and updated as indicated. Interim medical history since our last visit reviewed. Allergies and medications reviewed and updated.  Review of Systems  Psychiatric/Behavioral:  Positive for dysphoric mood. Negative for suicidal ideas. The patient is nervous/anxious.     Per HPI unless specifically indicated above     Objective:    BP 98/62   Pulse 79   Temp 98.7 F (37.1 C) (Oral)   Wt 177 lb 6.4 oz (80.5 kg)   SpO2 97%   BMI 24.88 kg/m   Wt Readings from Last 3 Encounters:  10/10/22 177 lb 6.4 oz (80.5 kg)  09/01/22 174 lb 12.8 oz (79.3 kg)  05/04/22 177 lb 6.4 oz (80.5 kg)    Physical Exam Vitals and nursing note reviewed.  Constitutional:      General: She  is not in acute distress.    Appearance: Normal appearance. She is normal weight. She is not ill-appearing, toxic-appearing or diaphoretic.  HENT:     Head: Normocephalic.     Right Ear: External ear normal.     Left Ear: External ear normal.     Nose: Nose normal.     Mouth/Throat:     Mouth: Mucous membranes are moist.     Pharynx: Oropharynx is clear.  Eyes:     General:        Right eye: No discharge.        Left eye: No discharge.     Extraocular Movements: Extraocular movements intact.     Conjunctiva/sclera: Conjunctivae normal.     Pupils: Pupils are equal, round, and reactive to light.  Cardiovascular:     Rate and Rhythm: Normal rate and regular rhythm.     Heart sounds: No murmur heard. Pulmonary:     Effort: Pulmonary effort is normal. No respiratory distress.     Breath sounds: Normal breath sounds. No  wheezing or rales.  Musculoskeletal:     Cervical back: Normal range of motion and neck supple.  Skin:    General: Skin is warm and dry.     Capillary Refill: Capillary refill takes less than 2 seconds.  Neurological:     General: No focal deficit present.     Mental Status: She is alert and oriented to person, place, and time. Mental status is at baseline.  Psychiatric:        Mood and Affect: Mood normal.        Behavior: Behavior normal.        Thought Content: Thought content normal.        Judgment: Judgment normal.     Results for orders placed or performed in visit on 09/01/22  Microscopic Examination   Urine  Result Value Ref Range   WBC, UA 0-5 0 - 5 /hpf   RBC, Urine 0-2 0 - 2 /hpf   Epithelial Cells (non renal) 0-10 0 - 10 /hpf   Bacteria, UA None seen None seen/Few  CBC with Differential/Platelet  Result Value Ref Range   WBC 7.6 3.4 - 10.8 x10E3/uL   RBC 4.28 3.77 - 5.28 x10E6/uL   Hemoglobin 13.1 11.1 - 15.9 g/dL   Hematocrit 39.7 34.0 - 46.6 %   MCV 93 79 - 97 fL   MCH 30.6 26.6 - 33.0 pg   MCHC 33.0 31.5 - 35.7 g/dL   RDW 13.1 11.7 -  15.4 %   Platelets 339 150 - 450 x10E3/uL   Neutrophils 44 Not Estab. %   Lymphs 43 Not Estab. %   Monocytes 9 Not Estab. %   Eos 3 Not Estab. %   Basos 1 Not Estab. %   Neutrophils Absolute 3.4 1.4 - 7.0 x10E3/uL   Lymphocytes Absolute 3.3 (H) 0.7 - 3.1 x10E3/uL   Monocytes Absolute 0.7 0.1 - 0.9 x10E3/uL   EOS (ABSOLUTE) 0.2 0.0 - 0.4 x10E3/uL   Basophils Absolute 0.1 0.0 - 0.2 x10E3/uL   Immature Granulocytes 0 Not Estab. %   Immature Grans (Abs) 0.0 0.0 - 0.1 x10E3/uL  Comprehensive metabolic panel  Result Value Ref Range   Glucose 107 (H) 70 - 99 mg/dL   BUN 15 6 - 24 mg/dL   Creatinine, Ser 0.79 0.57 - 1.00 mg/dL   eGFR 87 >59 mL/min/1.73   BUN/Creatinine Ratio 19 9 - 23   Sodium 139 134 - 144 mmol/L   Potassium 4.2 3.5 - 5.2 mmol/L   Chloride 101 96 - 106 mmol/L   CO2 23 20 - 29 mmol/L   Calcium 9.9 8.7 - 10.2 mg/dL   Total Protein 6.8 6.0 - 8.5 g/dL   Albumin 4.4 3.8 - 4.9 g/dL   Globulin, Total 2.4 1.5 - 4.5 g/dL   Albumin/Globulin Ratio 1.8 1.2 - 2.2   Bilirubin Total 0.3 0.0 - 1.2 mg/dL   Alkaline Phosphatase 125 (H) 44 - 121 IU/L   AST 18 0 - 40 IU/L   ALT 19 0 - 32 IU/L  Lipid panel  Result Value Ref Range   Cholesterol, Total 242 (H) 100 - 199 mg/dL   Triglycerides 161 (H) 0 - 149 mg/dL   HDL 58 >39 mg/dL   VLDL Cholesterol Cal 29 5 - 40 mg/dL   LDL Chol Calc (NIH) 155 (H) 0 - 99 mg/dL   Chol/HDL Ratio 4.2 0.0 - 4.4 ratio  TSH  Result Value Ref Range   TSH 2.060 0.450 - 4.500 uIU/mL  Urinalysis, Routine w reflex microscopic  Result Value Ref Range   Specific Gravity, UA 1.015 1.005 - 1.030   pH, UA 7.0 5.0 - 7.5   Color, UA Yellow Yellow   Appearance Ur Clear Clear   Leukocytes,UA Trace (A) Negative   Protein,UA Negative Negative/Trace   Glucose, UA Negative Negative   Ketones, UA Negative Negative   RBC, UA Negative Negative   Bilirubin, UA Negative Negative   Urobilinogen, Ur 0.2 0.2 - 1.0 mg/dL   Nitrite, UA Negative Negative   Microscopic  Examination See below:       Assessment & Plan:   Problem List Items Addressed This Visit       Other   Depression    Chronic. Improved with Celexa.  Will increase from '20mg'$  to '40mg'$ .  Follow up in 5 months.  Call sooner if concerns arise.       Relevant Medications   citalopram (CELEXA) 40 MG tablet   Anxiety - Primary    Chronic. Improved with Celexa.  Will increase from '20mg'$  to '40mg'$ .  Follow up in 5 months.  Call sooner if concerns arise.       Relevant Medications   citalopram (CELEXA) 40 MG tablet     Follow up plan: Return in about 5 months (around 03/11/2023) for HTN, HLD, DM2 FU.

## 2022-10-24 ENCOUNTER — Encounter: Payer: Self-pay | Admitting: Nurse Practitioner

## 2022-11-17 ENCOUNTER — Telehealth: Payer: Commercial Managed Care - PPO | Admitting: Family

## 2022-11-17 DIAGNOSIS — R232 Flushing: Secondary | ICD-10-CM

## 2022-11-17 MED ORDER — DESVENLAFAXINE SUCCINATE ER 100 MG PO TB24: 100.0000 mg | ORAL_TABLET | Freq: Every day | ORAL | 1 refills | Status: DC

## 2022-11-17 NOTE — Progress Notes (Signed)
Virtual Visit Consent   [name], you are scheduled for a virtual visit with a [facility] provider today. Just as with appointments in the office, your consent must be obtained to participate. Your consent will be active for this visit and any virtual visit you may have with one of our providers in the next 365 days. If you have a MyChart account, a copy of this consent can be sent to you electronically.  As this is a virtual visit, video technology does not allow for your provider to perform a traditional examination. This may limit your provider's ability to fully assess your condition. If your provider identifies any concerns that need to be evaluated in person or the need to arrange testing (such as labs, EKG, etc.), we will make arrangements to do so. Although advances in technology are sophisticated, we cannot ensure that it will always work on either your end or our end. If the connection with a video visit is poor, the visit may have to be switched to a telephone visit. With either a video or telephone visit, we are not always able to ensure that we have a secure connection.  By engaging in this virtual visit, you consent to the provision of healthcare and authorize for your insurance to be billed (if applicable) for the services provided during this visit. Depending on your insurance coverage, you may receive a charge related to this service.  I need to obtain your verbal consent now. Are you willing to proceed with your visit today? Virginia Austin has provided verbal consent on 11/17/2022 for a virtual visit (video or telephone). Evelina Dun, FNP  Date: 11/17/2022 6:11 PM  Virtual Visit via Video Note   I, Evelina Dun, connected with  Virginia Austin  ([id number], 1966-02-15) on 11/17/22 at  6:00 PM EST by a video-enabled telemedicine application and verified that I am speaking with the correct person using two identifiers.  Location: Patient: Virtual Visit Location Patient: Other:  car Provider: Virtual Visit Location Provider: Home Office   I discussed the limitations of evaluation and management by telemedicine and the availability of in person appointments. The patient expressed understanding and agreed to proceed.    History of Present Illness: Virginia Austin is a 57 y.o. who identifies as a female who was assigned female at birth, and is being seen today for hot flashes that started last week. Reports she feel hot, gets anxious, having trouble sleeping.   Tried OTC medications without relief.   HPI: HPI  Problems:  Patient Active Problem List   Diagnosis Date Noted   Acute cystitis without hematuria 05/04/2022   Laceration of lesser toe of right foot without foreign body present or damage to nail 06/17/2021   Follow-up exam 06/17/2021   Visit for suture removal 05/03/2021   Laceration of toe without foreign body present or damage to nail 04/08/2021   Rash 12/07/2020   Left hand pain 10/23/2020   Hyperlipidemia    Polyp of sigmoid colon    Anxiety 06/15/2017   Depression 10/04/2016    Allergies:  Allergies  Allergen Reactions   Amoxicillin Other (See Comments)    Yeast infection   Medications:  Current Outpatient Medications:    desvenlafaxine (PRISTIQ) 100 MG 24 hr tablet, Take 1 tablet (100 mg total) by mouth daily., Disp: 90 tablet, Rfl: 1   Multiple Vitamin (MULTIVITAMIN WITH MINERALS) TABS tablet, Take 1 tablet by mouth daily., Disp: , Rfl:   Observations/Objective: Patient is well-developed,  well-nourished in no acute distress.  Resting comfortably in car Head is normocephalic, atraumatic.  No labored breathing.  Speech is clear and coherent with logical content.  Patient is alert and oriented at baseline.    Assessment and Plan: 1. Hot flashes - desvenlafaxine (PRISTIQ) 100 MG 24 hr tablet; Take 1 tablet (100 mg total) by mouth daily.  Dispense: 90 tablet; Refill: 1  Pt will call PCP and make follow up Discussed gabapentin possible   Will stop Celexa and start pristiq 100 mg  Can take black cohosh   Follow Up Instructions: I discussed the assessment and treatment plan with the patient. The patient was provided an opportunity to ask questions and all were answered. The patient agreed with the plan and demonstrated an understanding of the instructions.  A copy of instructions were sent to the patient via MyChart unless otherwise noted below.     The patient was advised to call back or seek an in-person evaluation if the symptoms worsen or if the condition fails to improve as anticipated.  Time:  I spent 11 minutes with the patient via telehealth technology discussing the above problems/concerns.    Evelina Dun, FNP

## 2023-01-12 ENCOUNTER — Other Ambulatory Visit: Payer: Self-pay | Admitting: Obstetrics and Gynecology

## 2023-01-12 DIAGNOSIS — N959 Unspecified menopausal and perimenopausal disorder: Secondary | ICD-10-CM

## 2023-01-12 LAB — HM MAMMOGRAPHY

## 2023-01-13 ENCOUNTER — Encounter: Payer: Self-pay | Admitting: Nurse Practitioner

## 2023-02-06 ENCOUNTER — Encounter: Payer: Self-pay | Admitting: Nurse Practitioner

## 2023-02-10 ENCOUNTER — Ambulatory Visit: Payer: Commercial Managed Care - PPO | Admitting: Nurse Practitioner

## 2023-02-10 ENCOUNTER — Encounter: Payer: Self-pay | Admitting: Nurse Practitioner

## 2023-02-10 VITALS — BP 97/67 | HR 76 | Temp 98.0°F | Wt 172.6 lb

## 2023-02-10 DIAGNOSIS — R52 Pain, unspecified: Secondary | ICD-10-CM | POA: Diagnosis not present

## 2023-02-10 NOTE — Progress Notes (Signed)
BP 97/67   Pulse 76   Temp 98 F (36.7 C) (Oral)   Wt 172 lb 9.6 oz (78.3 kg)   SpO2 99%   BMI 24.21 kg/m    Subjective:    Patient ID: Virginia Austin, female    DOB: [birth year], 57 y.o.   MRN: [id number]  HPI: Virginia Austin is a 57 y.o. female  Chief Complaint  Patient presents with   Generalized Body Aches    Pt states doesn't feel sick or anything just pain all over body since Sunday.   BODY ACHES ALL OVER BODY Duration:   started on Sunday Severity: 7/10 - on Sunday but improved today Onset: sudden Context when symptoms started:  unknown Symptoms improve with rest:  rest is improving    Depressive symptoms: no Stress/anxiety: no Insomnia: yes  but improved Snoring: yes Observed apnea by bed partner: no Daytime hypersomnolence:no Wakes feeling refreshed: yes History of sleep study: no Dysnea on exertion:  no Orthopnea/PND: no Chest pain: no Chronic cough: no Lower extremity edema: no Arthralgias:yes Myalgias: no Weakness: no Rash: no Has noticed hand swelling and she wasn't able to grip a cup or close her hands.  This has also improved.  Doesn't notice that it is worse in the mornings.  The swelling was constant throughout the day.  She couldn't get her ring on her finger.     Relevant past medical, surgical, family and social history reviewed and updated as indicated. Interim medical history since our last visit reviewed. Allergies and medications reviewed and updated.  Review of Systems  Constitutional:        Body aches  Respiratory:  Negative for cough.   Cardiovascular:  Negative for chest pain.  Musculoskeletal:  Positive for arthralgias. Negative for myalgias.       Hand swelling  Neurological:  Negative for weakness.  Psychiatric/Behavioral:  Positive for sleep disturbance. Negative for dysphoric mood. The patient is not nervous/anxious.     Per HPI unless specifically indicated above     Objective:    BP 97/67   Pulse 76   Temp 98 F  (36.7 C) (Oral)   Wt 172 lb 9.6 oz (78.3 kg)   SpO2 99%   BMI 24.21 kg/m   Wt Readings from Last 3 Encounters:  02/10/23 172 lb 9.6 oz (78.3 kg)  10/10/22 177 lb 6.4 oz (80.5 kg)  09/01/22 174 lb 12.8 oz (79.3 kg)    Physical Exam Vitals and nursing note reviewed.  Constitutional:      General: She is not in acute distress.    Appearance: Normal appearance. She is normal weight. She is not ill-appearing, toxic-appearing or diaphoretic.  HENT:     Head: Normocephalic.     Right Ear: External ear normal.     Left Ear: External ear normal.     Nose: Nose normal.     Mouth/Throat:     Mouth: Mucous membranes are moist.     Pharynx: Oropharynx is clear.  Eyes:     General:        Right eye: No discharge.        Left eye: No discharge.     Extraocular Movements: Extraocular movements intact.     Conjunctiva/sclera: Conjunctivae normal.     Pupils: Pupils are equal, round, and reactive to light.  Cardiovascular:     Rate and Rhythm: Normal rate and regular rhythm.     Heart sounds: No murmur heard. Pulmonary:  Effort: Pulmonary effort is normal. No respiratory distress.     Breath sounds: Normal breath sounds. No wheezing or rales.  Musculoskeletal:     Cervical back: Normal range of motion and neck supple.  Skin:    General: Skin is warm and dry.     Capillary Refill: Capillary refill takes less than 2 seconds.  Neurological:     General: No focal deficit present.     Mental Status: She is alert and oriented to person, place, and time. Mental status is at baseline.  Psychiatric:        Mood and Affect: Mood normal.        Behavior: Behavior normal.        Thought Content: Thought content normal.        Judgment: Judgment normal.     Results for orders placed or performed in visit on 01/13/23  HM MAMMOGRAPHY  Result Value Ref Range   HM Mammogram 0-4 Bi-Rad 0-4 Bi-Rad, Self Reported Normal      Assessment & Plan:   Problem List Items Addressed This Visit    None Visit Diagnoses     Body aches    -  Primary   No recent illness or bug bites. Symptoms have improved some. Will check labs at visit today. Will make further recommendations based on lab results.   Relevant Orders   Thyroid Panel With TSH   Thyroid peroxidase antibody   Vitamin D (25 hydroxy)   Rheumatoid factor   ANA   ANA+ENA+DNA/DS+Antich+Centr   CK   Uric acid   Ehrlichia antibody panel   Comprehensive metabolic panel   CBC with Differential/Platelet   Spotted Fever Group Antibodies   Lyme Disease Serology w/Reflex        Follow up plan: Return if symptoms worsen or fail to improve.

## 2023-02-11 LAB — COMPREHENSIVE METABOLIC PANEL
Albumin: 4.2 g/dL (ref 3.8–4.9)
BUN/Creatinine Ratio: 16 (ref 9–23)
Chloride: 101 mmol/L (ref 96–106)

## 2023-02-11 LAB — SPOTTED FEVER GROUP ANTIBODIES

## 2023-02-11 LAB — LYME DISEASE SEROLOGY W/REFLEX

## 2023-02-11 LAB — CK: Total CK: 95 U/L (ref 32–182)

## 2023-02-11 LAB — ANA+ENA+DNA/DS+ANTICH+CENTR

## 2023-02-11 LAB — CBC WITH DIFFERENTIAL/PLATELET
Basos: 3 %
Monocytes Absolute: 0.5 10*3/uL (ref 0.1–0.9)

## 2023-02-11 LAB — EHRLICHIA ANTIBODY PANEL

## 2023-02-13 LAB — COMPREHENSIVE METABOLIC PANEL
BUN: 12 mg/dL (ref 6–24)
Potassium: 4.6 mmol/L (ref 3.5–5.2)
Sodium: 137 mmol/L (ref 134–144)
eGFR: 92 mL/min/{1.73_m2} (ref >59)

## 2023-02-13 LAB — EHRLICHIA ANTIBODY PANEL
E.Chaffeensis (HME) IgG: NEGATIVE
HGE IgM Titer: NEGATIVE

## 2023-02-13 LAB — ANA+ENA+DNA/DS+ANTICH+CENTR: ENA SM Ab Ser-aCnc: <0.2 AI (ref 0.0–0.9)

## 2023-02-15 LAB — ANA+ENA+DNA/DS+ANTICH+CENTR
Centromere Ab Screen: <0.2 AI (ref 0.0–0.9)
Chromatin Ab SerPl-aCnc: <0.2 AI (ref 0.0–0.9)
ENA RNP Ab: 0.2 AI (ref 0.0–0.9)
Scleroderma (Scl-70) (ENA) Antibody, IgG: <0.2 AI (ref 0.0–0.9)

## 2023-02-15 LAB — CBC WITH DIFFERENTIAL/PLATELET
EOS (ABSOLUTE): 0.2 10*3/uL (ref 0.0–0.4)
Hemoglobin: 13.2 g/dL (ref 11.1–15.9)
Lymphocytes Absolute: 1.9 10*3/uL (ref 0.7–3.1)
Lymphs: 33 %
Platelets: 429 10*3/uL (ref 150–450)
RDW: 13.7 % (ref 11.7–15.4)

## 2023-02-15 LAB — SPOTTED FEVER GROUP ANTIBODIES

## 2023-02-15 LAB — EHRLICHIA ANTIBODY PANEL: HGE IgG Titer: NEGATIVE

## 2023-02-15 LAB — COMPREHENSIVE METABOLIC PANEL
Calcium: 9.2 mg/dL (ref 8.7–10.2)
Globulin, Total: 2.9 g/dL (ref 1.5–4.5)

## 2023-02-15 NOTE — Progress Notes (Signed)
Hi Virginia Austin.  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  I will send you another message once that returns.  However, the rest of your lab work is unremarkable and does not explain your symptoms.  I hope they have improved.  If not, I think it would be worth while to see Ortho for evaluation of your joints.

## 2023-02-17 LAB — COMPREHENSIVE METABOLIC PANEL
ALT: 36 IU/L — ABNORMAL HIGH (ref 0–32)
AST: 20 IU/L (ref 0–40)
Albumin/Globulin Ratio: 1.4 (ref 1.2–2.2)
Alkaline Phosphatase: 143 IU/L — ABNORMAL HIGH (ref 44–121)
Bilirubin Total: 0.2 mg/dL (ref 0.0–1.2)
CO2: 21 mmol/L (ref 20–29)
Creatinine, Ser: 0.75 mg/dL (ref 0.57–1.00)
Glucose: 109 mg/dL — ABNORMAL HIGH (ref 70–99)
Total Protein: 7.1 g/dL (ref 6.0–8.5)

## 2023-02-17 LAB — ANA+ENA+DNA/DS+ANTICH+CENTR
ANA Titer 1: NEGATIVE
ENA SSA (RO) Ab: <0.2 AI (ref 0.0–0.9)
ENA SSB (LA) Ab: <0.2 AI (ref 0.0–0.9)
dsDNA Ab: 3 IU/mL (ref 0–9)

## 2023-02-17 LAB — CBC WITH DIFFERENTIAL/PLATELET
Basophils Absolute: 0.2 10*3/uL (ref 0.0–0.2)
Eos: 3 %
Hematocrit: 39.2 % (ref 34.0–46.6)
Immature Grans (Abs): 0.1 10*3/uL (ref 0.0–0.1)
Immature Granulocytes: 1 %
MCH: 30.6 pg (ref 26.6–33.0)
MCHC: 33.7 g/dL (ref 31.5–35.7)
MCV: 91 fL (ref 79–97)
Monocytes: 9 %
Neutrophils Absolute: 2.8 10*3/uL (ref 1.4–7.0)
Neutrophils: 51 %
RBC: 4.31 x10E6/uL (ref 3.77–5.28)
WBC: 5.6 10*3/uL (ref 3.4–10.8)

## 2023-02-17 LAB — VITAMIN D 25 HYDROXY (VIT D DEFICIENCY, FRACTURES): Vit D, 25-Hydroxy: 35.2 ng/mL (ref 30.0–100.0)

## 2023-02-17 LAB — THYROID PEROXIDASE ANTIBODY: Thyroperoxidase Ab SerPl-aCnc: 16 IU/mL (ref 0–34)

## 2023-02-17 LAB — THYROID PANEL WITH TSH
Free Thyroxine Index: 1.8 (ref 1.2–4.9)
T3 Uptake Ratio: 28 % (ref 24–39)
T4, Total: 6.4 ug/dL (ref 4.5–12.0)
TSH: 2.71 u[IU]/mL (ref 0.450–4.500)

## 2023-02-17 LAB — URIC ACID: Uric Acid: 5.1 mg/dL (ref 3.0–7.2)

## 2023-02-17 LAB — RHEUMATOID FACTOR: Rheumatoid fact SerPl-aCnc: <10 IU/mL (ref <14.0)

## 2023-02-17 LAB — ANA: Anti Nuclear Antibody (ANA): NEGATIVE

## 2023-02-23 ENCOUNTER — Encounter: Payer: Self-pay | Admitting: Nurse Practitioner

## 2023-02-27 MED ORDER — ESCITALOPRAM OXALATE 20 MG PO TABS: 20.0000 mg | ORAL_TABLET | Freq: Every day | ORAL | 1 refills | Status: DC

## 2023-03-13 ENCOUNTER — Ambulatory Visit: Payer: Commercial Managed Care - PPO | Admitting: Physician Assistant

## 2023-05-09 ENCOUNTER — Emergency Department: Payer: Commercial Managed Care - PPO

## 2023-05-09 ENCOUNTER — Encounter: Payer: Self-pay | Admitting: *Deleted

## 2023-05-09 ENCOUNTER — Other Ambulatory Visit: Payer: Self-pay

## 2023-05-09 ENCOUNTER — Emergency Department
Admission: EM | Admit: 2023-05-09 | Discharge: 2023-05-09 | Disposition: A | Discharge status: Home / Self Care | Payer: Commercial Managed Care - PPO | Attending: Emergency Medicine | Admitting: Emergency Medicine

## 2023-05-09 DIAGNOSIS — E871 Hypo-osmolality and hyponatremia: Secondary | ICD-10-CM | POA: Insufficient documentation

## 2023-05-09 DIAGNOSIS — R109 Unspecified abdominal pain: Secondary | ICD-10-CM

## 2023-05-09 DIAGNOSIS — R1012 Left upper quadrant pain: Secondary | ICD-10-CM | POA: Insufficient documentation

## 2023-05-09 LAB — URINALYSIS, ROUTINE W REFLEX MICROSCOPIC
Bacteria, UA: NONE SEEN
Bilirubin Urine: NEGATIVE
Glucose, UA: NEGATIVE mg/dL
Ketones, ur: NEGATIVE mg/dL
Leukocytes,Ua: NEGATIVE
Nitrite: NEGATIVE
Protein, ur: NEGATIVE mg/dL
Specific Gravity, Urine: 1.005 (ref 1.005–1.030)
pH: 5 (ref 5.0–8.0)

## 2023-05-09 LAB — COMPREHENSIVE METABOLIC PANEL
ALT: 22 U/L (ref 0–44)
AST: 23 U/L (ref 15–41)
Albumin: 4.1 g/dL (ref 3.5–5.0)
Alkaline Phosphatase: 100 U/L (ref 38–126)
Anion gap: 7 (ref 5–15)
BUN: 21 mg/dL — ABNORMAL HIGH (ref 6–20)
CO2: 23 mmol/L (ref 22–32)
Calcium: 9.1 mg/dL (ref 8.9–10.3)
Chloride: 101 mmol/L (ref 98–111)
Creatinine, Ser: 0.83 mg/dL (ref 0.44–1.00)
GFR, Estimated: >60 mL/min (ref >60)
Glucose, Bld: 109 mg/dL — ABNORMAL HIGH (ref 70–99)
Potassium: 4 mmol/L (ref 3.5–5.1)
Sodium: 131 mmol/L — ABNORMAL LOW (ref 135–145)
Total Bilirubin: 0.6 mg/dL (ref 0.3–1.2)
Total Protein: 7.4 g/dL (ref 6.5–8.1)

## 2023-05-09 LAB — CBC
HCT: 38.1 % (ref 36.0–46.0)
Hemoglobin: 12.7 g/dL (ref 12.0–15.0)
MCH: 30.4 pg (ref 26.0–34.0)
MCHC: 33.3 g/dL (ref 30.0–36.0)
MCV: 91.1 fL (ref 80.0–100.0)
Platelets: 329 10*3/uL (ref 150–400)
RBC: 4.18 MIL/uL (ref 3.87–5.11)
RDW: 12.9 % (ref 11.5–15.5)
WBC: 9.8 10*3/uL (ref 4.0–10.5)
nRBC: 0 % (ref 0.0–0.2)

## 2023-05-09 LAB — LIPASE, BLOOD: Lipase: 69 U/L — ABNORMAL HIGH (ref 11–51)

## 2023-05-09 MED ORDER — OXYCODONE-ACETAMINOPHEN 5-325 MG PO TABS: 1.0000 | ORAL_TABLET | ORAL | 0 refills | Status: AC | PRN

## 2023-05-09 MED ORDER — KETOROLAC TROMETHAMINE 15 MG/ML IJ SOLN
15.0000 mg | Freq: Once | INTRAMUSCULAR | Status: AC
  Administered 2023-05-09: 15 mg via INTRAVENOUS
  Filled 2023-05-09: qty 1

## 2023-05-09 MED ORDER — IOHEXOL 300 MG/ML  SOLN
100.0000 mL | Freq: Once | INTRAMUSCULAR | Status: AC | PRN
  Administered 2023-05-09: 100 mL via INTRAVENOUS

## 2023-05-09 MED ORDER — OXYCODONE-ACETAMINOPHEN 5-325 MG PO TABS
1.0000 | ORAL_TABLET | Freq: Once | ORAL | Status: AC
  Administered 2023-05-09: 1 via ORAL
  Filled 2023-05-09: qty 1

## 2023-05-09 MED ORDER — MORPHINE SULFATE (PF) 4 MG/ML IV SOLN
4.0000 mg | Freq: Once | INTRAVENOUS | Status: AC
  Administered 2023-05-09: 4 mg via INTRAVENOUS
  Filled 2023-05-09: qty 1

## 2023-05-09 MED ORDER — LIDOCAINE 5 % EX PTCH
1.0000 | MEDICATED_PATCH | CUTANEOUS | Status: DC
  Administered 2023-05-09: 1 via TRANSDERMAL
  Filled 2023-05-09: qty 1

## 2023-05-09 MED ORDER — SODIUM CHLORIDE 0.9 % IV BOLUS
500.0000 mL | Freq: Once | INTRAVENOUS | Status: AC
  Administered 2023-05-09: 500 mL via INTRAVENOUS

## 2023-05-09 NOTE — ED Triage Notes (Signed)
Patient reporting pain under the left breast (mid lateral abdomen) that radiates around to the flank/back, for about 2 days. Pain is sharp enough that it causes nausea.

## 2023-05-09 NOTE — ED Provider Notes (Signed)
[facility] Provider Note    Event Date/Time   First MD Initiated Contact with Patient 05/09/23 2138     (approximate)   History   Flank Pain   HPI  Virginia Austin is a 57 y.o. female no significant past medical history presents to the emergency department with left-sided abdominal pain.  Yesterday woke up with some left-sided abdominal pain and has been progressively worsening since that time.  Took ibuprofen without significant improvement of the pain.  Recent viral illness last week so she thought it was from a strained muscle but had ongoing symptoms which brought her into the emergency department.  Denies any diarrhea or constipation.  No blood in her stool.  No history of kidney stones.  No prior abdominal surgery.  Denies nausea or vomiting.  No dysuria.  Denies falls or trauma.  Denies chest pain or shortness of breath.  No history of DVT or PE.     Physical Exam   Triage Vital Signs: ED Triage Vitals  Encounter Vitals Group     BP 05/09/23 2016 131/78     Systolic BP Percentile --      Diastolic BP Percentile --      Pulse Rate 05/09/23 2016 83     Resp 05/09/23 2016 17     Temp 05/09/23 2016 98.5 F (36.9 C)     Temp Source 05/09/23 2016 Oral     SpO2 05/09/23 2016 100 %     Weight 05/09/23 2016 160 lb (72.6 kg)     Height 05/09/23 2016 5\' 11"  (1.803 m)     Head Circumference --      Peak Flow --      Pain Score 05/09/23 2016 8     Pain Loc --      Pain Education --      Exclude from Growth Chart --     Most recent vital signs: Vitals:   05/09/23 2016  BP: 131/78  Pulse: 83  Resp: 17  Temp: 98.5 F (36.9 C)  SpO2: 100%    Physical Exam Constitutional:      Appearance: She is well-developed.  HENT:     Head: Atraumatic.  Eyes:     Conjunctiva/sclera: Conjunctivae normal.  Cardiovascular:     Rate and Rhythm: Regular rhythm.  Pulmonary:     Effort: No respiratory distress.  Abdominal:     General: There is no  distension.     Tenderness: There is abdominal tenderness (Focal tenderness to the left upper abdomen). There is no right CVA tenderness or left CVA tenderness.     Comments: Well-healed scar on the abdomen (prior skin cancer removal)  Musculoskeletal:        General: Normal range of motion.     Cervical back: Normal range of motion.     Right lower leg: No edema.     Left lower leg: No edema.  Skin:    General: Skin is warm.     Capillary Refill: Capillary refill takes less than 2 seconds.  Neurological:     Mental Status: She is alert. Mental status is at baseline.     IMPRESSION / MDM / ASSESSMENT AND PLAN / ED COURSE  I reviewed the triage vital signs and the nursing notes.  Differential diagnosis including kidney stone, pyelonephritis, diverticulitis, intra-abdominal abscess.  Have a low suspicion for pulmonary embolism given no pleuritic pain, no shortness of breath, low risk Wells criteria  EKG  I,  Corena Herter, the attending physician, personally viewed and interpreted this ECG.   Rate: Normal  Rhythm: Normal sinus  Axis: Normal  Intervals: Normal  ST&T Change: None  No tachycardic or bradycardic dysrhythmias while on cardiac telemetry.  RADIOLOGY I independently reviewed imaging, my interpretation of imaging: CT abdomen and pelvis with contrast -thickened lateral musculature of the left abdominal wall.  Read as edematous lateral musculature abdominal wall thickening and changes.  Mild irregularity of the anterior left eighth rib.  No other acute intra-abdominal pathology.  LABS (all labs ordered are listed, but only abnormal results are displayed) Labs interpreted as -    Labs Reviewed  LIPASE, BLOOD - Abnormal; Notable for the following components:      Result Value   Lipase 69 (*)    All other components within normal limits  COMPREHENSIVE METABOLIC PANEL - Abnormal; Notable for the following components:   Sodium 131 (*)    Glucose, Bld 109 (*)    BUN 21  (*)    All other components within normal limits  URINALYSIS, ROUTINE W REFLEX MICROSCOPIC - Abnormal; Notable for the following components:   Color, Urine COLORLESS (*)    APPearance CLEAR (*)    Hgb urine dipstick SMALL (*)    All other components within normal limits  CBC     MDM    Lab work with mild hyponatremia.  Creatinine at baseline.  No signs of urinary tract infection.  Lipase was elevated to 69 but does not meet criteria for acute pancreatitis.  Patient was given IV morphine and IV fluids.  CT abdomen pelvis with contrast  Reevaluation ongoing pain.  Patient was given IV ketorolac, p.o. Percocet and Lidoderm patch  CT scan with focal edematous and thickening changes to the left abdominal wall musculature.  Patient does have focal tenderness at this area.  On exam no other signs of overlying erythema warmth or induration, have a low suspicion for abdominal wall cellulitis.  Patient did have significant amount of coughing last week, possibly pulled a muscle or strained.  No signs of pneumonia of the lower lungs.  Discussed close follow-up with primary care physician.  Discussed scheduling NSAIDs and will do a short course of narcotic pain medication for severe pain.  Discussed returning to the emergency department for any any ongoing or worsening symptoms.   PROCEDURES:  Critical Care performed: No  Procedures  Patient's presentation is most consistent with acute presentation with potential threat to life or bodily function.   MEDICATIONS ORDERED IN ED: Medications  lidocaine (LIDODERM) 5 % 1 patch (1 patch Transdermal Patch Applied 05/09/23 2343)  morphine (PF) 4 MG/ML injection 4 mg (4 mg Intravenous Given 05/09/23 2222)  sodium chloride 0.9 % bolus 500 mL (0 mLs Intravenous Stopped 05/09/23 2321)  iohexol (OMNIPAQUE) 300 MG/ML solution 100 mL (100 mLs Intravenous Contrast Given 05/09/23 2237)  ketorolac (TORADOL) 15 MG/ML injection 15 mg (15 mg Intravenous Given 05/09/23  2343)  oxyCODONE-acetaminophen (PERCOCET/ROXICET) 5-325 MG per tablet 1 tablet (1 tablet Oral Given 05/09/23 2343)    FINAL CLINICAL IMPRESSION(S) / ED DIAGNOSES   Final diagnoses:  Left flank pain  Abdominal wall pain in left flank     Rx / DC Orders   ED Discharge Orders          Ordered    oxyCODONE-acetaminophen (PERCOCET) 5-325 MG tablet  Every 4 hours PRN        05/09/23 2343  Note:  This document was prepared using Dragon voice recognition software and may include unintentional dictation errors.   Corena Herter, MD 05/09/23 [phone]

## 2023-05-09 NOTE — Discharge Instructions (Addendum)
You were evaluated in the emergency department for left-sided abdominal pain.  You had a CT scan that showed inflammation to the left-sided abdominal wall muscles just below your chest.  You had some calcification to the left ninth rib.  You have no signs of pneumonia.  Your urine did not look infected.  Follow-up closely with your primary care physician, call tomorrow to be seen this week for reevaluation.  Return to the emergency department for any ongoing or worsening symptoms.  Pain control:  Ibuprofen (motrin/aleve/advil) - You can take 3 tablets (600 mg) every 6 hours as needed for pain/fever.  Acetaminophen (tylenol) - You can take 2 extra strength tablets (1000 mg) every 6 hours as needed for pain/fever.  You can alternate these medications or take them together.  Make sure you eat food/drink water when taking these medications.  You were given a prescription for narcotic pain medications.  Take only if in severe pain.  These are very addictive medications.  These medications can make you constipated.  If you need to take more than 1-2 doses, start a stool softner.  If you become constipated, take 1 capfull of MiraLAX, can repeat untill having regular bowel movements.  Keep this medication out of reach of any children.  Could also use a Lidoderm patch 4% over-the-counter every 12 hours as needed for pain control.

## 2023-05-11 ENCOUNTER — Telehealth: Payer: Self-pay

## 2023-05-11 NOTE — Transitions of Care (Post Inpatient/ED Visit) (Signed)
   05/11/2023  Name: Virginia Austin MRN: [id number] DOB: 1966/01/22  Today's TOC FU Call Status: Today's TOC FU Call Status:: Successful TOC FU Call Completed TOC FU Call Complete Date: 05/11/23 Patient's Name and Date of Birth confirmed.  Transition Care Management Follow-up Telephone Call Date of Discharge: 05/09/23 Discharge Facility: [facility]) Type of Discharge: Emergency Department Reason for ED Visit: Other: How have you been since you were released from the hospital?: Better Any questions or concerns?: No  Items Reviewed: Did you receive and understand the discharge instructions provided?: Yes Medications obtained,verified, and reconciled?: Yes (Medications Reviewed) Any new allergies since your discharge?: No Dietary orders reviewed?: No Do you have support at home?: Yes People in Home: spouse, sibling(s)  Medications Reviewed Today: Medications Reviewed Today     Reviewed by Pablo Ledger, CMA (Certified Medical Assistant) on 05/11/23 at 1106  Med List Status: <None>   Medication Order Taking? Sig Documenting Provider Last Dose Status Informant  escitalopram (LEXAPRO) 20 MG tablet [id number] Yes Take 1 tablet (20 mg total) by mouth daily. Larae Grooms, NP Taking Active   Multiple Vitamin (MULTIVITAMIN WITH MINERALS) TABS tablet [id number] Yes Take 1 tablet by mouth daily. [provider] Taking Active Self  oxyCODONE-acetaminophen (PERCOCET) 5-325 MG tablet [id number] Yes Take 1 tablet by mouth every 4 (four) hours as needed for up to 3 days for severe pain. Corena Herter, MD Taking Active             Home Care and Equipment/Supplies: Were Home Health Services Ordered?: No Any new equipment or medical supplies ordered?: No  Functional Questionnaire: Do you need assistance with bathing/showering or dressing?: No Do you need assistance with meal preparation?: No Do you need assistance with eating?: No Do you have  difficulty maintaining continence: No Do you need assistance with getting out of bed/getting out of a chair/moving?: No Do you have difficulty managing or taking your medications?: No  Follow up appointments reviewed: PCP Follow-up appointment confirmed?: Yes Date of PCP follow-up appointment?: 05/12/23 Follow-up Provider: Jacquelin Hawking, [facility] Follow-up appointment confirmed?: No Do you need transportation to your follow-up appointment?: No Do you understand care options if your condition(s) worsen?: Yes-patient verbalized understanding    SIGNATURE: Wilhemena Durie, CMA

## 2023-05-12 ENCOUNTER — Encounter: Payer: Self-pay | Admitting: Physician Assistant

## 2023-05-12 ENCOUNTER — Ambulatory Visit: Payer: Commercial Managed Care - PPO | Admitting: Physician Assistant

## 2023-05-12 VITALS — BP 92/57 | HR 66 | Ht 71.0 in | Wt 174.0 lb

## 2023-05-12 DIAGNOSIS — M940 Chondrocostal junction syndrome [Tietze]: Secondary | ICD-10-CM

## 2023-05-12 MED ORDER — PREDNISONE 20 MG PO TABS
ORAL_TABLET | ORAL | 0 refills | Status: DC
Start: 2023-05-12 — End: 2023-09-14

## 2023-05-12 NOTE — Progress Notes (Signed)
Acute Office Visit   Patient: Virginia Austin   DOB: 1966/04/14   57 y.o. Female  MRN: [id number] Visit Date: 05/12/2023  Today's healthcare provider: Oswaldo Conroy Evangelyn Crouse, PA-C  Introduced myself to the patient as a Secondary school teacher and provided education on APPs in clinical practice.    Chief Complaint  Patient presents with   Abdominal Pain    Patient says the area is better, but still sore. Patient says she was prescribed pain medication and says the medication makes her sleepy and says she has stopped taking it. Patient says she takes Ibuprofen for the pain and discomfort.    Subjective    HPI HPI     Abdominal Pain    Additional comments: Patient says the area is better, but still sore. Patient says she was prescribed pain medication and says the medication makes her sleepy and says she has stopped taking it. Patient says she takes Ibuprofen for the pain and discomfort.       Last edited by Malen Gauze, CMA on 05/12/2023  1:39 PM.       Patient was seen in the ED for left sided abdominal pain on 05/09/23   She reports she is feeling better than she was previously but is still having some pain She reports pain along the left side of her abdomen  Pain level: 2-3/10 constant, achy in nature   She did have a cold last week and was coughing a lot She denies trauma, injuries or bruising to the area She denies worsening pain with deep inspiration She reports pain is worse with coughing, walking, turning over in bed She denies alleviating factors other than rest   She reports minimal relief with the pain medication she was given in the ED- has helped her sleep but nothing more      Medications: Outpatient Medications Prior to Visit  Medication Sig   escitalopram (LEXAPRO) 20 MG tablet Take 1 tablet (20 mg total) by mouth daily.   Multiple Vitamin (MULTIVITAMIN WITH MINERALS) TABS tablet Take 1 tablet by mouth daily.   [DISCONTINUED] citalopram (CELEXA) 20 MG tablet     oxyCODONE-acetaminophen (PERCOCET) 5-325 MG tablet Take 1 tablet by mouth every 4 (four) hours as needed for up to 3 days for severe pain. (Patient not taking: Reported on 05/12/2023)   No facility-administered medications prior to visit.    Review of Systems  Constitutional:  Negative for chills, diaphoresis, fatigue and fever.  Respiratory:  Negative for shortness of breath and wheezing.   Cardiovascular:  Negative for chest pain.  Gastrointestinal:  Positive for abdominal pain. Negative for blood in stool, constipation, diarrhea, nausea and vomiting.  Neurological:  Negative for dizziness and light-headedness.        Objective    BP (!) 92/57 (BP Location: Left Arm, Cuff Size: Normal)   Pulse 66   Ht 5\' 11"  (1.803 m)   Wt 174 lb (78.9 kg)   SpO2 95%   BMI 24.27 kg/m     Physical Exam Vitals reviewed.  Constitutional:      General: She is awake.     Appearance: Normal appearance. She is well-developed and well-groomed.  HENT:     Head: Normocephalic and atraumatic.  Cardiovascular:     Rate and Rhythm: Normal rate and regular rhythm.     Pulses:          Radial pulses are 2+ on the right side and 2+ on the  left side.     Heart sounds: Normal heart sounds. No murmur heard.    No friction rub. No gallop.  Pulmonary:     Effort: Pulmonary effort is normal.     Breath sounds: Normal breath sounds. No decreased air movement. No decreased breath sounds, wheezing, rhonchi or rales.  Abdominal:     General: Abdomen is flat. There is no distension.     Palpations: Abdomen is soft. There is no shifting dullness or mass.  Musculoskeletal:     Right lower leg: No edema.     Left lower leg: No edema.  Neurological:     Mental Status: She is alert.  Psychiatric:        Attention and Perception: Attention and perception normal.        Mood and Affect: Mood and affect normal.        Speech: Speech normal.        Behavior: Behavior normal. Behavior is cooperative.       No  results found for any visits on 05/12/23.  Assessment & Plan      No follow-ups on file.      Problem List Items Addressed This Visit   None Visit Diagnoses     Costochondritis    -  Primary Acute, new concern She reports severe left sided abdominal and left sided lower rib pain that is gradually improving  She was seen in the ED on 05/09/23 - reviewed ED visit notes, labs and imaging  CT abdomen from ED visit noted the following with regards to the area of concern: Musculoskeletal: No acute bony abnormality is noted. Thickening of the lateral abdominal musculature on the left is noted with associated edema. Mild irregularity of the left eighth rib costal cartilage is seen with calcifications. This is best noted on image number 90 of series 5. Correlate with any history of recent blunt trauma as to possible fracture involving the junction of the costal cartilage and rib.   IMPRESSION: Thickening and edematous changes in the lateral musculature of the left abdominal wall just below the ribcage. This corresponds to the area of clinical symptomatology. Mild irregularity of the anterior aspect of the left eighth rib costal cartilage is noted. Correlate with any history of recent trauma.  Concerned for costochondritis  at this time given imaging results, recent illness with vigorous coughing  Will send in a script for Prednisone taper to assist with inflammation and irritation She can continue with OTC pain relievers and Oxycodone as preferred for pain control Reviewed ED and return precautions Follow up as needed for persistent or progressing symptoms       Relevant Medications   predniSONE (DELTASONE) 20 MG tablet        No follow-ups on file.   I, Daymen Hassebrock E Nithin Demeo, PA-C, have reviewed all documentation for this visit. The documentation on 05/12/23 for the exam, diagnosis, procedures, and orders are all accurate and complete.   Jacquelin Hawking, MHS, PA-C [facility]

## 2023-07-14 ENCOUNTER — Encounter: Payer: Self-pay | Admitting: Physician Assistant

## 2023-07-14 NOTE — Telephone Encounter (Signed)
Called and scheduled patient on 07/18/2023 @ 11:20 am.

## 2023-07-17 NOTE — Progress Notes (Deleted)
There were no vitals taken for this visit.   Subjective:    Patient ID: Virginia Austin, female    DOB: 06-Feb-1966, 57 y.o.   MRN: [id number]  HPI: Virginia Austin is a 57 y.o. female  No chief complaint on file.  INSOMNIA Duration: {Blank single:19197::"chronic","months","years"} Satisfied with sleep quality: {Blank single:19197::"yes","no"} Difficulty falling asleep: {Blank single:19197::"yes","no"} Difficulty staying asleep: {Blank single:19197::"yes","no"} Waking a few hours after sleep onset: {Blank single:19197::"yes","no"} Early morning awakenings: {Blank single:19197::"yes","no"} Daytime hypersomnolence: {Blank single:19197::"yes","no"} Wakes feeling refreshed: {Blank single:19197::"yes","no"} Good sleep hygiene: {Blank single:19197::"yes","no"} Apnea: {Blank single:19197::"yes","no"} Snoring: {Blank single:19197::"yes","no"} Depressed/anxious mood: {Blank single:19197::"yes","no"} Recent stress: {Blank single:19197::"yes","no"} Restless legs/nocturnal leg cramps: {Blank single:19197::"yes","no"} Chronic pain/arthritis: {Blank single:19197::"yes","no"} History of sleep study: {Blank single:19197::"yes","no"} Treatments attempted: {Blank multiple:19196::"none","melatonin","uinsom","benadryl","ambien"}   Relevant past medical, surgical, family and social history reviewed and updated as indicated. Interim medical history since our last visit reviewed. Allergies and medications reviewed and updated.  Review of Systems  Per HPI unless specifically indicated above     Objective:    There were no vitals taken for this visit.  Wt Readings from Last 3 Encounters:  05/12/23 174 lb (78.9 kg)  05/09/23 160 lb (72.6 kg)  02/10/23 172 lb 9.6 oz (78.3 kg)    Physical Exam  Results for orders placed or performed during the hospital encounter of 05/09/23  Lipase, blood  Result Value Ref Range   Lipase 69 (H) 11 - 51 U/L  Comprehensive metabolic panel  Result Value Ref Range    Sodium 131 (L) 135 - 145 mmol/L   Potassium 4.0 3.5 - 5.1 mmol/L   Chloride 101 98 - 111 mmol/L   CO2 23 22 - 32 mmol/L   Glucose, Bld 109 (H) 70 - 99 mg/dL   BUN 21 (H) 6 - 20 mg/dL   Creatinine, Ser 4.09 0.44 - 1.00 mg/dL   Calcium 9.1 8.9 - 81.1 mg/dL   Total Protein 7.4 6.5 - 8.1 g/dL   Albumin 4.1 3.5 - 5.0 g/dL   AST 23 15 - 41 U/L   ALT 22 0 - 44 U/L   Alkaline Phosphatase 100 38 - 126 U/L   Total Bilirubin 0.6 0.3 - 1.2 mg/dL   GFR, Estimated >91 >47 mL/min   Anion gap 7 5 - 15  CBC  Result Value Ref Range   WBC 9.8 4.0 - 10.5 K/uL   RBC 4.18 3.87 - 5.11 MIL/uL   Hemoglobin 12.7 12.0 - 15.0 g/dL   HCT 82.9 56.2 - 13.0 %   MCV 91.1 80.0 - 100.0 fL   MCH 30.4 26.0 - 34.0 pg   MCHC 33.3 30.0 - 36.0 g/dL   RDW 86.5 78.4 - 69.6 %   Platelets 329 150 - 400 K/uL   nRBC 0.0 0.0 - 0.2 %  Urinalysis, Routine w reflex microscopic -Urine, Clean Catch  Result Value Ref Range   Color, Urine COLORLESS (A) YELLOW   APPearance CLEAR (A) CLEAR   Specific Gravity, Urine 1.005 1.005 - 1.030   pH 5.0 5.0 - 8.0   Glucose, UA NEGATIVE NEGATIVE mg/dL   Hgb urine dipstick SMALL (A) NEGATIVE   Bilirubin Urine NEGATIVE NEGATIVE   Ketones, ur NEGATIVE NEGATIVE mg/dL   Protein, ur NEGATIVE NEGATIVE mg/dL   Nitrite NEGATIVE NEGATIVE   Leukocytes,Ua NEGATIVE NEGATIVE   RBC / HPF 0-5 0 - 5 RBC/hpf   WBC, UA 0-5 0 - 5 WBC/hpf   Bacteria, UA NONE SEEN NONE SEEN   Squamous Epithelial /  HPF 0-5 0 - 5 /HPF      Assessment & Plan:   Problem List Items Addressed This Visit   None    Follow up plan: No follow-ups on file.  This visit was completed via MyChart due to the restrictions of the COVID-19 pandemic. All issues as above were discussed and addressed. Physical exam was done as above through visual confirmation on MyChart. If it was felt that the patient should be evaluated in the office, they were directed there. The patient verbally consented to this visit. Location of the patient:  *** Location of the provider: Office Those involved with this call:  Provider: Larae Grooms, NP CMA: *** Front Desk/Registration: *** This encounter was conducted via ***.  I spent *** dedicated to the care of this patient on the date of this encounter to include previsit review of ***, face to face time with the patient, and post visit ordering of testing.

## 2023-07-18 ENCOUNTER — Telehealth: Payer: Commercial Managed Care - PPO | Admitting: Nurse Practitioner

## 2023-08-09 ENCOUNTER — Ambulatory Visit
Admission: RE | Admit: 2023-08-09 | Discharge: 2023-08-09 | Disposition: A | Discharge status: Home / Self Care | Payer: Commercial Managed Care - PPO | Source: Ambulatory Visit | Attending: Obstetrics and Gynecology | Admitting: Obstetrics and Gynecology

## 2023-08-09 DIAGNOSIS — N959 Unspecified menopausal and perimenopausal disorder: Secondary | ICD-10-CM

## 2023-09-12 ENCOUNTER — Encounter: Payer: Self-pay | Admitting: Physician Assistant

## 2023-09-14 ENCOUNTER — Encounter: Payer: Self-pay | Admitting: Nurse Practitioner

## 2023-09-14 ENCOUNTER — Ambulatory Visit: Payer: Commercial Managed Care - PPO | Admitting: Nurse Practitioner

## 2023-09-14 VITALS — BP 111/73 | HR 87 | Temp 98.3°F | Ht 71.0 in | Wt 178.6 lb

## 2023-09-14 DIAGNOSIS — F331 Major depressive disorder, recurrent, moderate: Secondary | ICD-10-CM | POA: Diagnosis not present

## 2023-09-14 DIAGNOSIS — E782 Mixed hyperlipidemia: Secondary | ICD-10-CM

## 2023-09-14 DIAGNOSIS — F419 Anxiety disorder, unspecified: Secondary | ICD-10-CM | POA: Diagnosis not present

## 2023-09-14 DIAGNOSIS — R7309 Other abnormal glucose: Secondary | ICD-10-CM

## 2023-09-14 DIAGNOSIS — Z0000 Encounter for general adult medical examination without abnormal findings: Secondary | ICD-10-CM

## 2023-09-14 LAB — URINALYSIS, ROUTINE W REFLEX MICROSCOPIC
Bilirubin, UA: NEGATIVE
Glucose, UA: NEGATIVE
Ketones, UA: NEGATIVE
Leukocytes,UA: NEGATIVE
Nitrite, UA: NEGATIVE
Protein,UA: NEGATIVE
RBC, UA: NEGATIVE
Specific Gravity, UA: 1.02 (ref 1.005–1.030)
Urobilinogen, Ur: 0.2 mg/dL (ref 0.2–1.0)
pH, UA: 6 (ref 5.0–7.5)

## 2023-09-14 MED ORDER — CITALOPRAM HYDROBROMIDE 40 MG PO TABS: 40.0000 mg | ORAL_TABLET | Freq: Every day | ORAL | 1 refills | Status: DC

## 2023-09-14 NOTE — Assessment & Plan Note (Signed)
 Chronic. Doing okay but could be better.  Will change from Lexapro to Celexa.  Labs ordered today.  Follow up in 6 months.  Call sooner if concerns arise.

## 2023-09-14 NOTE — Progress Notes (Signed)
 BP 111/73 (BP Location: Right Arm, Patient Position: Sitting, Cuff Size: Large)   Pulse 87   Temp 98.3 F (36.8 C) (Oral)   Ht 5' 11 (1.803 m)   Wt 178 lb 9.6 oz (81 kg)   SpO2 96%   BMI 24.91 kg/m    Subjective:    Patient ID: Virginia Austin, female    DOB: 13-Jan-1966, 58 y.o.   MRN: [id number]  HPI: Virginia Austin is a 58 y.o. female presenting on 09/14/2023 for comprehensive medical examination. Current medical complaints include:none  She currently lives with: Menopausal Symptoms: no  HYPERLIPIDEMIA Hyperlipidemia status: excellent compliance Satisfied with current treatment?  no Side effects:  no Medication compliance: excellent compliance Past cholesterol meds: none Supplements: none Aspirin:  no The 10-year ASCVD risk score (Arnett DK, et al., 2019) is: 2.3%   Values used to calculate the score:     Age: 21 years     Sex: Female     Is Non-Hispanic African American: No     Diabetic: No     Tobacco smoker: No     Systolic Blood Pressure: 111 mmHg     Is BP treated: No     HDL Cholesterol: 58 mg/dL     Total Cholesterol: 242 mg/dL Chest pain:  no Coronary artery disease:  no Family history CAD:  no Family history early CAD:  no  MOOD Patient states she is doing well.  She feels like the 20mg  is working well for her but not perfect.  Feels like she gets more irritable than she would like to. Denies SI.   Depression Screen done today and results listed below:     09/14/2023    1:22 PM 05/12/2023    1:42 PM 02/10/2023    8:31 AM 10/10/2022    8:48 AM 09/01/2022    8:32 AM  Depression screen PHQ 2/9  Decreased Interest 0 0 0 0 0  Down, Depressed, Hopeless 0 0 0 0 0  PHQ - 2 Score 0 0 0 0 0  Altered sleeping 0 0 0 0 0  Tired, decreased energy 0 0 0 0 0  Change in appetite 0 0 0 0 0  Feeling bad or failure about yourself  0 0 0 0 0  Trouble concentrating 0 0 0 0 0  Moving slowly or fidgety/restless 0 0 0 0 0  Suicidal thoughts 0 0 0 0 0  PHQ-9 Score 0 0 0 0 0   Difficult doing work/chores  Not difficult at all Not difficult at all Not difficult at all Not difficult at all    The patient does not have a history of falls. I did complete a risk assessment for falls. A plan of care for falls was documented.   Past Medical History:  Past Medical History:  Diagnosis Date   Anxiety    Arthritis    knees   Chronic headaches    otc med prn   Depression    Dysthymic disorder    Hyperlipidemia    diet controlled, no meds   Impaired fasting glucose    Lumbago    Neck sprain and strain    Hx   Vitamin D  deficiency    Hx - resolved per patient   Wears contact lenses     Surgical History:  Past Surgical History:  Procedure Laterality Date   COLONOSCOPY WITH PROPOFOL  N/A 06/06/2019   Procedure: COLONOSCOPY WITH BIOPSY;  Surgeon: Jinny Carmine, MD;  Location: [facility];  Service: Endoscopy;  Laterality: N/A;   DILATATION & CURETTAGE/HYSTEROSCOPY WITH MYOSURE N/A 11/19/2015   Procedure: DILATATION & CURETTAGE/HYSTEROSCOPY WITH MYOSURE;  Surgeon: Charlie Flowers, MD;  Location: [facility] OR[facility];  Service: Gynecology;  Laterality: N/A;   HERNIA REPAIR     POLYPECTOMY N/A 06/06/2019   Procedure: POLYPECTOMY;  Surgeon: Jinny Carmine, MD;  Location: [facility];  Service: Endoscopy;  Laterality: N/A;   SKIN CANCER EXCISION     TONSILLECTOMY      Medications:  Current Outpatient Medications on File Prior to Visit  Medication Sig   Multiple Vitamin (MULTIVITAMIN WITH MINERALS) TABS tablet Take 1 tablet by mouth daily.   No current facility-administered medications on file prior to visit.    Allergies:  Allergies  Allergen Reactions   Amoxicillin  Other (See Comments)    Yeast infection  Yeast infection Yeast infection    Yeast infection    Social History:  Social History   Socioeconomic History   Marital status: Married    Spouse name: Not on file   Number of children: Not on file   Years of education: Not on file   Highest  education level: Not on file  Occupational History   Not on file  Tobacco Use   Smoking status: Never   Smokeless tobacco: Never  Vaping Use   Vaping status: Never Used  Substance and Sexual Activity   Alcohol use: Yes    Alcohol/week: 1.0 standard drink of alcohol    Types: 1 Cans of beer per week    Comment: occasional beer   Drug use: No   Sexual activity: Yes    Birth control/protection: Post-menopausal  Other Topics Concern   Not on file  Social History Narrative   Not on file   Social Drivers of Health   Financial Resource Strain: Not on file  Food Insecurity: Not on file  Transportation Needs: Not on file  Physical Activity: Not on file  Stress: Not on file  Social Connections: Unknown (01/24/2022)   [facility]   Social Network    Social Network: Not on file  Intimate Partner Violence: Unknown (12/16/2021)   Received from [employer], [facility]   HITS    Physically Hurt: Not on file    Insult or Talk Down To: Not on file    Threaten Physical Harm: Not on file    Scream or Curse: Not on file   Social History   Tobacco Use  Smoking Status Never  Smokeless Tobacco Never   Social History   Substance and Sexual Activity  Alcohol Use Yes   Alcohol/week: 1.0 standard drink of alcohol   Types: 1 Cans of beer per week   Comment: occasional beer    Family History:  Family History  Problem Relation Age of Onset   Diabetes Father    Hypertension Father    Hypertension Sister    Diabetes Paternal Grandmother    Hypertension Paternal Grandmother    Thyroid  disease Paternal Grandmother     Past medical history, surgical history, medications, allergies, family history and social history reviewed with patient today and changes made to appropriate areas of the chart.   Review of Systems  Eyes:  Negative for blurred vision and double vision.  Respiratory:  Negative for shortness of breath.   Cardiovascular:  Negative for  chest pain, palpitations and leg swelling.  Neurological:  Negative for dizziness and headaches.  Psychiatric/Behavioral:  Positive for depression. Negative for  suicidal ideas. The patient is nervous/anxious.    All other ROS negative except what is listed above and in the HPI.      Objective:    BP 111/73 (BP Location: Right Arm, Patient Position: Sitting, Cuff Size: Large)   Pulse 87   Temp 98.3 F (36.8 C) (Oral)   Ht 5' 11 (1.803 m)   Wt 178 lb 9.6 oz (81 kg)   SpO2 96%   BMI 24.91 kg/m   Wt Readings from Last 3 Encounters:  09/14/23 178 lb 9.6 oz (81 kg)  05/12/23 174 lb (78.9 kg)  05/09/23 160 lb (72.6 kg)    Physical Exam Vitals and nursing note reviewed.  Constitutional:      General: She is awake. She is not in acute distress.    Appearance: Normal appearance. She is well-developed. She is not ill-appearing.  HENT:     Head: Normocephalic and atraumatic.     Right Ear: Hearing, tympanic membrane, ear canal and external ear normal. No drainage.     Left Ear: Hearing, tympanic membrane, ear canal and external ear normal. No drainage.     Nose: Nose normal.     Right Sinus: No maxillary sinus tenderness or frontal sinus tenderness.     Left Sinus: No maxillary sinus tenderness or frontal sinus tenderness.     Mouth/Throat:     Mouth: Mucous membranes are moist.     Pharynx: Oropharynx is clear. Uvula midline. No pharyngeal swelling, oropharyngeal exudate or posterior oropharyngeal erythema.  Eyes:     General: Lids are normal.        Right eye: No discharge.        Left eye: No discharge.     Extraocular Movements: Extraocular movements intact.     Conjunctiva/sclera: Conjunctivae normal.     Pupils: Pupils are equal, round, and reactive to light.     Visual Fields: Right eye visual fields normal and left eye visual fields normal.  Neck:     Thyroid : No thyromegaly.     Vascular: No carotid bruit.     Trachea: Trachea normal.  Cardiovascular:     Rate and  Rhythm: Normal rate and regular rhythm.     Heart sounds: Normal heart sounds. No murmur heard.    No gallop.  Pulmonary:     Effort: Pulmonary effort is normal. No accessory muscle usage or respiratory distress.     Breath sounds: Normal breath sounds.  Chest:  Breasts:    Right: Normal.     Left: Normal.  Abdominal:     General: Bowel sounds are normal.     Palpations: Abdomen is soft. There is no hepatomegaly or splenomegaly.     Tenderness: There is no abdominal tenderness.  Musculoskeletal:        General: Normal range of motion.     Cervical back: Normal range of motion and neck supple.     Right lower leg: No edema.     Left lower leg: No edema.  Lymphadenopathy:     Head:     Right side of head: No submental, submandibular, tonsillar, preauricular or posterior auricular adenopathy.     Left side of head: No submental, submandibular, tonsillar, preauricular or posterior auricular adenopathy.     Cervical: No cervical adenopathy.     Upper Body:     Right upper body: No supraclavicular, axillary or pectoral adenopathy.     Left upper body: No supraclavicular, axillary or pectoral adenopathy.  Skin:  General: Skin is warm and dry.     Capillary Refill: Capillary refill takes less than 2 seconds.     Findings: No rash.  Neurological:     Mental Status: She is alert and oriented to person, place, and time.     Gait: Gait is intact.  Psychiatric:        Attention and Perception: Attention normal.        Mood and Affect: Mood normal.        Speech: Speech normal.        Behavior: Behavior normal. Behavior is cooperative.        Thought Content: Thought content normal.        Judgment: Judgment normal.     Results for orders placed or performed during the hospital encounter of 05/09/23  Urinalysis, Routine w reflex microscopic -Urine, Clean Catch   Collection Time: 05/09/23  8:19 PM  Result Value Ref Range   Color, Urine COLORLESS (A) YELLOW   APPearance CLEAR (A)  CLEAR   Specific Gravity, Urine 1.005 1.005 - 1.030   pH 5.0 5.0 - 8.0   Glucose, UA NEGATIVE NEGATIVE mg/dL   Hgb urine dipstick SMALL (A) NEGATIVE   Bilirubin Urine NEGATIVE NEGATIVE   Ketones, ur NEGATIVE NEGATIVE mg/dL   Protein, ur NEGATIVE NEGATIVE mg/dL   Nitrite NEGATIVE NEGATIVE   Leukocytes,Ua NEGATIVE NEGATIVE   RBC / HPF 0-5 0 - 5 RBC/hpf   WBC, UA 0-5 0 - 5 WBC/hpf   Bacteria, UA NONE SEEN NONE SEEN   Squamous Epithelial / HPF 0-5 0 - 5 /HPF  Lipase, blood   Collection Time: 05/09/23  8:21 PM  Result Value Ref Range   Lipase 69 (H) 11 - 51 U/L  Comprehensive metabolic panel   Collection Time: 05/09/23  8:21 PM  Result Value Ref Range   Sodium 131 (L) 135 - 145 mmol/L   Potassium 4.0 3.5 - 5.1 mmol/L   Chloride 101 98 - 111 mmol/L   CO2 23 22 - 32 mmol/L   Glucose, Bld 109 (H) 70 - 99 mg/dL   BUN 21 (H) 6 - 20 mg/dL   Creatinine, Ser 9.16 0.44 - 1.00 mg/dL   Calcium  9.1 8.9 - 10.3 mg/dL   Total Protein 7.4 6.5 - 8.1 g/dL   Albumin 4.1 3.5 - 5.0 g/dL   AST 23 15 - 41 U/L   ALT 22 0 - 44 U/L   Alkaline Phosphatase 100 38 - 126 U/L   Total Bilirubin 0.6 0.3 - 1.2 mg/dL   GFR, Estimated >39 >39 mL/min   Anion gap 7 5 - 15  CBC   Collection Time: 05/09/23  8:21 PM  Result Value Ref Range   WBC 9.8 4.0 - 10.5 K/uL   RBC 4.18 3.87 - 5.11 MIL/uL   Hemoglobin 12.7 12.0 - 15.0 g/dL   HCT 61.8 63.9 - 53.9 %   MCV 91.1 80.0 - 100.0 fL   MCH 30.4 26.0 - 34.0 pg   MCHC 33.3 30.0 - 36.0 g/dL   RDW 87.0 88.4 - 84.4 %   Platelets 329 150 - 400 K/uL   nRBC 0.0 0.0 - 0.2 %      Assessment & Plan:   Problem List Items Addressed This Visit       Other   Depression   Chronic. Doing okay but could be better.  Will change from Lexapro  to Celexa .  Labs ordered today.  Follow up in 6 months.  Call  sooner if concerns arise.       Relevant Medications   citalopram  (CELEXA ) 40 MG tablet   Anxiety   Chronic. Doing okay but could be better.  Will change from Lexapro  to  Celexa .  Labs ordered today.  Follow up in 6 months.  Call sooner if concerns arise.       Relevant Medications   citalopram  (CELEXA ) 40 MG tablet   Hyperlipidemia   Labs ordered at visit today.  Will make recommendations based on lab results.        Relevant Orders   Lipid panel   Other Visit Diagnoses       Annual physical exam    -  Primary   Health maintenance reviewed during visit today.  Labs ordered.  PAP, Mammogram and Colonoscopy up to date.  Reviewed vaccines.   Relevant Orders   CBC with Differential/Platelet   Comprehensive metabolic panel   Lipid panel   TSH   Urinalysis, Routine w reflex microscopic     Elevated glucose       Relevant Orders   HgB A1c        Follow up plan: Return in about 6 months (around 03/13/2024) for HTN, HLD, DM2 FU.   LABORATORY TESTING:  - Pap smear: up to date  IMMUNIZATIONS:   - Tdap: Tetanus vaccination status reviewed: last tetanus booster within 10 years. - Influenza: Refused - Pneumovax: Not applicable - Prevnar: Not applicable - COVID: Not applicable - HPV: Not applicable - Shingrix vaccine: Refused  SCREENING: -Mammogram: Up to date  - Colonoscopy: Up to date  - Bone Density: Not applicable  -Hearing Test: Not applicable  -Spirometry: Not applicable   PATIENT COUNSELING:   Advised to take 1 mg of folate supplement per day if capable of pregnancy.   Sexuality: Discussed sexually transmitted diseases, partner selection, use of condoms, avoidance of unintended pregnancy  and contraceptive alternatives.   Advised to avoid cigarette smoking.  I discussed with the patient that most people either abstain from alcohol or drink within safe limits (<=14/week and <=4 drinks/occasion for males, <=7/weeks and <= 3 drinks/occasion for females) and that the risk for alcohol disorders and other health effects rises proportionally with the number of drinks per week and how often a drinker exceeds daily limits.  Discussed  cessation/primary prevention of drug use and availability of treatment for abuse.   Diet: Encouraged to adjust caloric intake to maintain  or achieve ideal body weight, to reduce intake of dietary saturated fat and total fat, to limit sodium intake by avoiding high sodium foods and not adding table salt, and to maintain adequate dietary potassium and calcium  preferably from fresh fruits, vegetables, and low-fat dairy products.    stressed the importance of regular exercise  Injury prevention: Discussed safety belts, safety helmets, smoke detector, smoking near bedding or upholstery.   Dental health: Discussed importance of regular tooth brushing, flossing, and dental visits.    NEXT PREVENTATIVE PHYSICAL DUE IN 1 YEAR. Return in about 6 months (around 03/13/2024) for HTN, HLD, DM2 FU.

## 2023-09-14 NOTE — Assessment & Plan Note (Signed)
 Labs ordered at visit today.  Will make recommendations based on lab results.

## 2023-09-15 LAB — CBC WITH DIFFERENTIAL/PLATELET
Basophils Absolute: 0.1 10*3/uL (ref 0.0–0.2)
Basos: 1 %
EOS (ABSOLUTE): 0.2 10*3/uL (ref 0.0–0.4)
Eos: 3 %
Hematocrit: 40.1 % (ref 34.0–46.6)
Hemoglobin: 13.3 g/dL (ref 11.1–15.9)
Immature Grans (Abs): 0 10*3/uL (ref 0.0–0.1)
Immature Granulocytes: 0 %
Lymphocytes Absolute: 2.9 10*3/uL (ref 0.7–3.1)
Lymphs: 46 %
MCH: 30.6 pg (ref 26.6–33.0)
MCHC: 33.2 g/dL (ref 31.5–35.7)
MCV: 92 fL (ref 79–97)
Monocytes Absolute: 0.6 10*3/uL (ref 0.1–0.9)
Monocytes: 9 %
Neutrophils Absolute: 2.5 10*3/uL (ref 1.4–7.0)
Neutrophils: 41 %
Platelets: 347 10*3/uL (ref 150–450)
RBC: 4.35 x10E6/uL (ref 3.77–5.28)
RDW: 13.3 % (ref 11.7–15.4)
WBC: 6.2 10*3/uL (ref 3.4–10.8)

## 2023-09-15 LAB — HEMOGLOBIN A1C
Est. average glucose Bld gHb Est-mCnc: 134 mg/dL
Hgb A1c MFr Bld: 6.3 % — ABNORMAL HIGH (ref 4.8–5.6)

## 2023-09-15 LAB — LIPID PANEL
Chol/HDL Ratio: 6.2 {ratio} — ABNORMAL HIGH (ref 0.0–4.4)
Cholesterol, Total: 302 mg/dL — ABNORMAL HIGH (ref 100–199)
HDL: 49 mg/dL (ref >39)
LDL Chol Calc (NIH): 205 mg/dL — ABNORMAL HIGH (ref 0–99)
Triglycerides: 246 mg/dL — ABNORMAL HIGH (ref 0–149)
VLDL Cholesterol Cal: 48 mg/dL — ABNORMAL HIGH (ref 5–40)

## 2023-09-15 LAB — COMPREHENSIVE METABOLIC PANEL
ALT: 28 [IU]/L (ref 0–32)
AST: 25 [IU]/L (ref 0–40)
Albumin: 4.2 g/dL (ref 3.8–4.9)
Alkaline Phosphatase: 123 [IU]/L — ABNORMAL HIGH (ref 44–121)
BUN/Creatinine Ratio: 12 (ref 9–23)
BUN: 9 mg/dL (ref 6–24)
Bilirubin Total: 0.3 mg/dL (ref 0.0–1.2)
CO2: 22 mmol/L (ref 20–29)
Calcium: 9.6 mg/dL (ref 8.7–10.2)
Chloride: 100 mmol/L (ref 96–106)
Creatinine, Ser: 0.78 mg/dL (ref 0.57–1.00)
Globulin, Total: 2.6 g/dL (ref 1.5–4.5)
Glucose: 89 mg/dL (ref 70–99)
Potassium: 4.4 mmol/L (ref 3.5–5.2)
Sodium: 137 mmol/L (ref 134–144)
Total Protein: 6.8 g/dL (ref 6.0–8.5)
eGFR: 88 mL/min/{1.73_m2} (ref >59)

## 2023-09-15 LAB — TSH: TSH: 1.49 u[IU]/mL (ref 0.450–4.500)

## 2023-10-19 ENCOUNTER — Encounter: Payer: Self-pay | Admitting: Pediatrics

## 2023-10-19 ENCOUNTER — Ambulatory Visit: Payer: Commercial Managed Care - PPO | Admitting: Pediatrics

## 2023-10-19 VITALS — BP 101/67 | HR 88 | Temp 99.6°F | Resp 17 | Wt 178.8 lb

## 2023-10-19 DIAGNOSIS — J069 Acute upper respiratory infection, unspecified: Secondary | ICD-10-CM | POA: Diagnosis not present

## 2023-10-19 DIAGNOSIS — R399 Unspecified symptoms and signs involving the genitourinary system: Secondary | ICD-10-CM

## 2023-10-19 LAB — URINALYSIS, ROUTINE W REFLEX MICROSCOPIC
Bilirubin, UA: NEGATIVE
Glucose, UA: NEGATIVE
Ketones, UA: NEGATIVE
Nitrite, UA: NEGATIVE
Protein,UA: NEGATIVE
Specific Gravity, UA: 1.01 (ref 1.005–1.030)
Urobilinogen, Ur: 0.2 mg/dL (ref 0.2–1.0)
pH, UA: 7 (ref 5.0–7.5)

## 2023-10-19 LAB — MICROSCOPIC EXAMINATION

## 2023-10-19 MED ORDER — DOXYCYCLINE HYCLATE 100 MG PO TABS
100.0000 mg | ORAL_TABLET | Freq: Two times a day (BID) | ORAL | 0 refills | Status: AC
Start: 2023-10-19 — End: 2023-10-24

## 2023-10-19 NOTE — Patient Instructions (Addendum)
 Plan to treat your UTI and suspect atypical pneumonia (walking pneumonia) with doxycycline  100mg  twice daily for 5 days. Please return if not better next week.  Most cold symptoms last up to 2 weeks, but cough can sometimes linger up to 4 weeks.  However if your symtpoms get WORSE - like you develop fevers or get more shortness of breath, then call your clinic as you may need to be evaluated.   Aches and Pains Acetaminophen  (Tylenol ): 1000mg  (extra strength tablets are 500mg , so take 2) every 8 hours if needed  Ibuprofen  (Advil /Motrin ) 400-800mg  (comes in 200mg  pills OTC, so 2-4 pills) every 8 hours. - Avoid in excess if cardiac blood pressure issues  Sore Throat:  See Aches and Pains meds above, also Sore throat sprays and lozenges may also help.   Cough:  Honey 2 TBS every 4-6 hours if needed.  Robitussin DM syrup or generic equivalent which has (guaifenesin = an expectorant to help you get stuff up + dextromethorphan (DM) = cough supressant). You can also get this in tablet formula (like Mucinex DM or generic equivalent).  If you have asthma or are wheezing and have a tight chest, then albuterol inhaler (Ventolin, ProAir) may be helpful - you need a prescription for this.   Congestion:  oxymetazoline (Afrin) nasal stray: 2 sprays each nostril every 12 hours. Don't use more than 3 days in a row to avoid building a tolerance to it.  Sinus rinse (neti pot) high volume sinus rinse can help open up your sinuses and be helpful, especially if you're having sinus pressure and headaches.   Other:  Umcka (pelargonium sidoides extract) can to shorten cold symptoms (can be hard to find, but [employer] carries it: brand name Umcka ColdCare from [employer]). Works best if you start taking at earliest signs of cold symptoms.  Andrographis paniculata is another herbal remedy with less evidence, but may reduce common cold symptoms in adults.  zinc acetate lozenges >= 80 mg/day reduces duration but  not severity of cold symptoms in adults, but it is associated with bad taste and nausea Heated humidified air may reduce cold symptoms, so try using a humidifier - especially in your bedroom at night.  Stay hydrated! Aim to drink at least 2 liters of water  daily.   What doesn't work (but lots of folks think might) Vitamin C: bummer right?! But there's no evidence that high dose vitamin C will help cold symptoms.

## 2023-10-19 NOTE — Progress Notes (Signed)
 Office Visit  BP 101/67 (BP Location: Left Arm, Patient Position: Sitting, Cuff Size: Normal)   Pulse 88   Temp 99.6 F (37.6 C) (Oral)   Resp 17   Wt 178 lb 12.8 oz (81.1 kg)   SpO2 94%   BMI 24.94 kg/m    Subjective:    Patient ID: Virginia Austin, female    DOB: Mar 22, 1966, 58 y.o.   MRN: [id number]  HPI: Virginia Austin is a 58 y.o. female  Chief Complaint  Patient presents with   Urinary Tract Infection    Started over the weekend. Pain with urination and not able to empty. Frequency.    URI    Sinus pressure, headache, congestion, chest pain from coughing. Achy joint pain. Coughing. Started last weekend. Sore throat, no GI    Discussed the use of AI scribe software for clinical note transcription with the patient, who gave verbal consent to proceed.  History of Present Illness   Virginia Austin is a 58 year old female who presents with urinary and upper respiratory symptoms.  Virginia Austin has been experiencing urinary symptoms since the past weekend, characterized by difficulty emptying Virginia Austin bladder, pain in the lower abdomen, a sensation of pressure, and increased frequency of urination. There is no burning sensation during urination or hematuria.  In addition to urinary symptoms, Virginia Austin has upper respiratory symptoms that began as a cold and have progressively worsened. Virginia Austin symptoms include a persistent cough that causes headaches and chest pain. Virginia Austin has experienced chills at home, although Virginia Austin is usually the warmest in Virginia Austin household, and denies any known fevers. Virginia Austin mentions exposure to children with colds and that two children Virginia Austin knows have tested positive for flu and COVID. Virginia Austin is currently using DayQuil and NyQuil to manage Virginia Austin symptoms, which help Virginia Austin sleep at night.     Relevant past medical, surgical, family and social history reviewed and updated as indicated. Interim medical history since our last visit reviewed. Allergies and medications reviewed and updated.  ROS per HPI unless  specifically indicated above     Objective:    BP 101/67 (BP Location: Left Arm, Patient Position: Sitting, Cuff Size: Normal)   Pulse 88   Temp 99.6 F (37.6 C) (Oral)   Resp 17   Wt 178 lb 12.8 oz (81.1 kg)   SpO2 94%   BMI 24.94 kg/m   Wt Readings from Last 3 Encounters:  10/19/23 178 lb 12.8 oz (81.1 kg)  09/14/23 178 lb 9.6 oz (81 kg)  05/12/23 174 lb (78.9 kg)     Physical Exam Constitutional:      Appearance: Normal appearance.  HENT:     Head: Normocephalic and atraumatic.  Eyes:     Pupils: Pupils are equal, round, and reactive to light.  Cardiovascular:     Rate and Rhythm: Normal rate and regular rhythm.     Pulses: Normal pulses.     Heart sounds: Normal heart sounds.  Pulmonary:     Effort: Pulmonary effort is normal.     Breath sounds: Rales present.     Comments: Anterior upper lung bilaterally with faint rales Abdominal:     General: Abdomen is flat.     Palpations: Abdomen is soft.  Musculoskeletal:        General: Normal range of motion.     Cervical back: Normal range of motion.  Skin:    General: Skin is warm and dry.     Capillary Refill: Capillary refill takes  less than 2 seconds.  Neurological:     General: No focal deficit present.     Mental Status: Virginia Austin is alert. Mental status is at baseline.  Psychiatric:        Mood and Affect: Mood normal.        Behavior: Behavior normal.         09/14/2023    1:22 PM 05/12/2023    1:42 PM 02/10/2023    8:31 AM 10/10/2022    8:48 AM 09/01/2022    8:32 AM  Depression screen PHQ 2/9  Decreased Interest 0 0 0 0 0  Down, Depressed, Hopeless 0 0 0 0 0  PHQ - 2 Score 0 0 0 0 0  Altered sleeping 0 0 0 0 0  Tired, decreased energy 0 0 0 0 0  Change in appetite 0 0 0 0 0  Feeling bad or failure about yourself  0 0 0 0 0  Trouble concentrating 0 0 0 0 0  Moving slowly or fidgety/restless 0 0 0 0 0  Suicidal thoughts 0 0 0 0 0  PHQ-9 Score 0 0 0 0 0  Difficult doing work/chores  Not difficult at all  Not difficult at all Not difficult at all Not difficult at all       09/14/2023    1:22 PM 05/12/2023    1:42 PM 02/10/2023    8:31 AM 10/10/2022    8:49 AM  GAD 7 : Generalized Anxiety Score  Nervous, Anxious, on Edge 1 0 0 0  Control/stop worrying 1 0 0 0  Worry too much - different things 1 0 0 0  Trouble relaxing 1 0 0 0  Restless 1 0 0 0  Easily annoyed or irritable 1 0 0 0  Afraid - awful might happen 1 0 0 0  Total GAD 7 Score 7 0 0 0  Anxiety Difficulty  Not difficult at all Not difficult at all Not difficult at all       Assessment & Plan:  Assessment & Plan   Upper respiratory tract infection, unspecified type Symptoms suggestive of a viral infection progressing to possible atypical pneumonia. No history of asthma or COPD. Flu and COVID tests negative. Amoxicillin  intolerance. Would want atypical coverage and UTI coverage so will do doxy vs z pack. -Start Doxycycline , twice daily for 5 days. -Continue over-the-counter symptomatic treatment with DayQuil and Nyquil. -Return for follow-up if not improved at the end of antibiotic course. -     Respiratory Panel w/ SARS-CoV2 -     Rapid Strep Screen ([facility] ONLY) -     Veritor Flu A/B Waived -     Doxycycline  Hyclate; Take 1 tablet (100 mg total) by mouth 2 (two) times daily for 5 days.  Dispense: 10 tablet; Refill: 0 -     Culture, Group A Strep  Urinary tract infection symptoms Complaints of urinary frequency and pressure, no dysuria or hematuria. Urinalysis confirmed UTI. -Start Doxycycline , twice daily for 5 days as above -     Urinalysis, Routine w reflex microscopic -     Urine Culture -     Doxycycline  Hyclate; Take 1 tablet (100 mg total) by mouth 2 (two) times daily for 5 days.  Dispense: 10 tablet; Refill: 0 -     Microscopic Examination  Follow up plan: Return if symptoms worsen or fail to improve.  Wells Mabe SHAUNNA NETT, MD

## 2023-10-21 LAB — RESPIRATORY PANEL W/ SARS-COV2

## 2023-10-21 LAB — URINE CULTURE

## 2023-10-22 ENCOUNTER — Telehealth: Payer: Commercial Managed Care - PPO | Admitting: Family

## 2023-10-22 DIAGNOSIS — J209 Acute bronchitis, unspecified: Secondary | ICD-10-CM | POA: Diagnosis not present

## 2023-10-22 LAB — VERITOR FLU A/B WAIVED
Influenza A: NEGATIVE
Influenza B: NEGATIVE

## 2023-10-22 LAB — CULTURE, GROUP A STREP: Strep A Culture: NEGATIVE

## 2023-10-22 LAB — RAPID STREP SCREEN (MED CTR MEBANE ONLY): Strep Gp A Ag, IA W/Reflex: NEGATIVE

## 2023-10-22 MED ORDER — PREDNISONE 10 MG (21) PO TBPK: ORAL_TABLET | ORAL | 0 refills | Status: DC

## 2023-10-22 MED ORDER — BENZONATATE 100 MG PO CAPS
100.0000 mg | ORAL_CAPSULE | Freq: Three times a day (TID) | ORAL | 0 refills | Status: DC | PRN
Start: 2023-10-22 — End: 2023-12-12

## 2023-10-22 NOTE — Progress Notes (Signed)
 Virtual Visit Consent   [name], you are scheduled for a virtual visit with a [facility] provider today. Just as with appointments in the office, your consent must be obtained to participate. Your consent will be active for this visit and any virtual visit you may have with one of our providers in the next 365 days. If you have a MyChart account, a copy of this consent can be sent to you electronically.  As this is a virtual visit, video technology does not allow for your provider to perform a traditional examination. This may limit your provider's ability to fully assess your condition. If your provider identifies any concerns that need to be evaluated in person or the need to arrange testing (such as labs, EKG, etc.), we will make arrangements to do so. Although advances in technology are sophisticated, we cannot ensure that it will always work on either your end or our end. If the connection with a video visit is poor, the visit may have to be switched to a telephone visit. With either a video or telephone visit, we are not always able to ensure that we have a secure connection.  By engaging in this virtual visit, you consent to the provision of healthcare and authorize for your insurance to be billed (if applicable) for the services provided during this visit. Depending on your insurance coverage, you may receive a charge related to this service.  I need to obtain your verbal consent now. Are you willing to proceed with your visit today? Dekisha S Bolls has provided verbal consent on 10/22/2023 for a virtual visit (video or telephone). Bari Learn, FNP  Date: 10/22/2023 7:07 PM  Virtual Visit via Video Note   I, Bari Learn, connected with  Kamron SHIRLENA BRINEGAR  ([id number], October 22, 1965) on 10/22/23 at  7:15 PM EST by a video-enabled telemedicine application and verified that I am speaking with the correct person using two identifiers.  Location: Patient: Virtual Visit Location Patient:  Home Provider: Virtual Visit Location Provider: Home Office   I discussed the limitations of evaluation and management by telemedicine and the availability of in person appointments. The patient expressed understanding and agreed to proceed.    History of Present Illness: Virginia Austin is a 58 y.o. who identifies as a female who was assigned female at birth, and is being seen today for cough that started Thursday. She was seen in the office for UTI and given doxycyline. This has helped her UTI but not her cough.   HPI: Cough This is a new problem. The current episode started in the past 7 days. The problem has been gradually worsening. The problem occurs constantly. The cough is Non-productive. Associated symptoms include headaches, myalgias and nasal congestion. Pertinent negatives include no chills, ear congestion, ear pain, fever, shortness of breath or wheezing. She has tried OTC cough suppressant and OTC inhaler for the symptoms.    Problems:  Patient Active Problem List   Diagnosis Date Noted   Acute cystitis without hematuria 05/04/2022   Laceration of lesser toe of right foot without foreign body present or damage to nail 06/17/2021   Follow-up exam 06/17/2021   Visit for suture removal 05/03/2021   Laceration of toe without foreign body present or damage to nail 04/08/2021   Rash 12/07/2020   Left hand pain 10/23/2020   Hyperlipidemia    Polyp of sigmoid colon    Anxiety 06/15/2017   Depression 10/04/2016    Allergies:  Allergies  Allergen Reactions  Amoxicillin  Other (See Comments)    Yeast infection  Yeast infection Yeast infection    Yeast infection   Medications:  Current Outpatient Medications:    benzonatate  (TESSALON  PERLES) 100 MG capsule, Take 1 capsule (100 mg total) by mouth 3 (three) times daily as needed., Disp: 20 capsule, Rfl: 0   predniSONE  (STERAPRED UNI-PAK 21 TAB) 10 MG (21) TBPK tablet, Use as directed, Disp: 21 tablet, Rfl: 0   citalopram  (CELEXA )  40 MG tablet, Take 1 tablet (40 mg total) by mouth daily., Disp: 90 tablet, Rfl: 1   doxycycline  (VIBRA -TABS) 100 MG tablet, Take 1 tablet (100 mg total) by mouth 2 (two) times daily for 5 days., Disp: 10 tablet, Rfl: 0   Multiple Vitamin (MULTIVITAMIN WITH MINERALS) TABS tablet, Take 1 tablet by mouth daily., Disp: , Rfl:   Observations/Objective: Patient is well-developed, well-nourished in no acute distress.  Resting comfortably  at home.  Head is normocephalic, atraumatic.  No labored breathing.  Speech is clear and coherent with logical content.  Patient is alert and oriented at baseline.  Dry nonproductive cough  Assessment and Plan: 1. Acute bronchitis, unspecified organism (Primary) - predniSONE  (STERAPRED UNI-PAK 21 TAB) 10 MG (21) TBPK tablet; Use as directed  Dispense: 21 tablet; Refill: 0 - benzonatate  (TESSALON  PERLES) 100 MG capsule; Take 1 capsule (100 mg total) by mouth 3 (three) times daily as needed.  Dispense: 20 capsule; Refill: 0  - Take meds as prescribed - Use a cool mist humidifier  -Use saline nose sprays frequently -Force fluids -For any cough or congestion  Use plain Mucinex- regular strength or max strength is fine -For fever or aces or pains- take tylenol  or ibuprofen . -Throat lozenges if help -Follow up if symptoms worsen or do not improve   Follow Up Instructions: I discussed the assessment and treatment plan with the patient. The patient was provided an opportunity to ask questions and all were answered. The patient agreed with the plan and demonstrated an understanding of the instructions.  A copy of instructions were sent to the patient via MyChart unless otherwise noted below.     The patient was advised to call back or seek an in-person evaluation if the symptoms worsen or if the condition fails to improve as anticipated.    Bari Learn, FNP

## 2023-10-25 ENCOUNTER — Other Ambulatory Visit: Payer: Self-pay | Admitting: Pediatrics

## 2023-10-25 DIAGNOSIS — R052 Subacute cough: Secondary | ICD-10-CM

## 2023-10-25 MED ORDER — HYDROCOD POLI-CHLORPHE POLI ER 10-8 MG/5ML PO SUER
5.0000 mL | Freq: Two times a day (BID) | ORAL | 0 refills | Status: AC | PRN
Start: 2023-10-25 — End: 2023-11-04

## 2023-12-12 ENCOUNTER — Ambulatory Visit: Payer: Self-pay | Admitting: Nurse Practitioner

## 2023-12-12 ENCOUNTER — Encounter: Payer: Self-pay | Admitting: Nurse Practitioner

## 2023-12-12 ENCOUNTER — Ambulatory Visit: Admitting: Nurse Practitioner

## 2023-12-12 VITALS — BP 103/65 | HR 69 | Temp 98.6°F | Resp 15 | Ht 70.98 in | Wt 172.0 lb

## 2023-12-12 DIAGNOSIS — N3000 Acute cystitis without hematuria: Secondary | ICD-10-CM | POA: Diagnosis not present

## 2023-12-12 DIAGNOSIS — R300 Dysuria: Secondary | ICD-10-CM | POA: Diagnosis not present

## 2023-12-12 LAB — URINALYSIS, ROUTINE W REFLEX MICROSCOPIC
Bilirubin, UA: NEGATIVE
Glucose, UA: NEGATIVE
Ketones, UA: NEGATIVE
Nitrite, UA: NEGATIVE
Specific Gravity, UA: 1.01 (ref 1.005–1.030)
Urobilinogen, Ur: 0.2 mg/dL (ref 0.2–1.0)
pH, UA: 6 (ref 5.0–7.5)

## 2023-12-12 LAB — MICROSCOPIC EXAMINATION
Bacteria, UA: NONE SEEN
RBC, Urine: NONE SEEN /HPF (ref 0–2)

## 2023-12-12 MED ORDER — NITROFURANTOIN MONOHYD MACRO 100 MG PO CAPS: 100.0000 mg | ORAL_CAPSULE | Freq: Two times a day (BID) | ORAL | 0 refills | Status: DC

## 2023-12-12 NOTE — Telephone Encounter (Signed)
 Copied from CRM [id number]. Topic: Clinical - Red Word Triage >> Dec 12, 2023  9:27 AM Izetta Dakin wrote: [facility] that prompted transfer to Nurse Triage: burning with urination,   Chief Complaint: Burning with urinatin Symptoms: bladder pressure Frequency: constant Pertinent Negatives: Patient denies fever Disposition: [] ED /[] Urgent Care (no appt availability in office) / [x] Appointment(In office/virtual)/ []  [facility]/ [] Home Care/ [] Refused Recommended Disposition /[] [facility]/ []  Follow-up with PCP Additional Notes: appt scheduled for 4/1 at 4  Reason for Disposition  Urinating more frequently than usual (i.e., frequency)    Burning with urination, feels like bladder is full  Answer Assessment - Initial Assessment Questions 1. SYMPTOM: "What's the main symptom you're concerned about?" (e.g., frequency, incontinence)     Burning, not eemptying bladder  2. ONSET: "When did the  burning  start?"     3 days ago  3. PAIN: "Is there any pain?" If Yes, ask: "How bad is it?" (Scale: 1-10; mild, moderate, severe)     Yes, 7/10 pain  4. CAUSE: "What do you think is causing the symptoms?"     UTI  5. OTHER SYMPTOMS: "Do you have any other symptoms?" (e.g., blood in urine, fever, flank pain, pain with urination)     Pain, pressure in bladder, urinary hesistancy  6. PREGNANCY: "Is there any chance you are pregnant?" "When was your last menstrual period?"     no  Protocols used: Urinary Symptoms-A-AH

## 2023-12-12 NOTE — Progress Notes (Unsigned)
 BP 103/65 (BP Location: Left Arm, Patient Position: Sitting, Cuff Size: Normal)   Pulse 69   Temp 98.6 F (37 C) (Oral)   Resp 15   Ht 5' 10.98" (1.803 m)   Wt 172 lb (78 kg)   SpO2 98%   BMI 24.00 kg/m    Subjective:    Patient ID: Virginia Austin, female    DOB: 05-Mar-1966, 58 y.o.   MRN: [id number]  HPI: Virginia Austin is a 58 y.o. female  Chief Complaint  Patient presents with   UTI    Started about 3 days ago. Can't empty bladder and painful, urgency, no burning, frequency. Increased fluids.    URINARY SYMPTOMS Symptoms started about 3 days ago Dysuria: yes Urinary frequency: yes Urgency: yes Small volume voids: yes Symptom severity: no Urinary incontinence: no Foul odor: no Hematuria: no Abdominal pain: no Back pain: yes Suprapubic pain/pressure: no Flank pain: no Fever:  no Vomiting: no Relief with cranberry juice: no Relief with pyridium: no Status: stable Previous urinary tract infection: yes Recurrent urinary tract infection: no Sexual activity: No sexually active/monogomous/practicing safe sex History of sexually transmitted disease: no Penile discharge: no Treatments attempted: increasing fluids   Relevant past medical, surgical, family and social history reviewed and updated as indicated. Interim medical history since our last visit reviewed. Allergies and medications reviewed and updated.  Review of Systems  Per HPI unless specifically indicated above     Objective:    BP 103/65 (BP Location: Left Arm, Patient Position: Sitting, Cuff Size: Normal)   Pulse 69   Temp 98.6 F (37 C) (Oral)   Resp 15   Ht 5' 10.98" (1.803 m)   Wt 172 lb (78 kg)   SpO2 98%   BMI 24.00 kg/m   Wt Readings from Last 3 Encounters:  12/12/23 172 lb (78 kg)  10/19/23 178 lb 12.8 oz (81.1 kg)  09/14/23 178 lb 9.6 oz (81 kg)    Physical Exam  Results for orders placed or performed in visit on 10/19/23  Urine Culture   Collection Time: 10/19/23  3:44 PM    Specimen: Urine   UR  Result Value Ref Range   Urine Culture, Routine Final report    Organism ID, Bacteria Comment   Microscopic Examination   Collection Time: 10/19/23  3:44 PM   Urine  Result Value Ref Range   WBC, UA 0-5 0 - 5 /hpf   RBC, Urine 0-2 0 - 2 /hpf   Epithelial Cells (non renal) 0-10 0 - 10 /hpf   Bacteria, UA Few None seen/Few  Urinalysis, Routine w reflex microscopic   Collection Time: 10/19/23  3:44 PM  Result Value Ref Range   Specific Gravity, UA 1.010 1.005 - 1.030   pH, UA 7.0 5.0 - 7.5   Color, UA Yellow Yellow   Appearance Ur Clear Clear   Leukocytes,UA 1+ (A) Negative   Protein,UA Negative Negative/Trace   Glucose, UA Negative Negative   Ketones, UA Negative Negative   RBC, UA Trace (A) Negative   Bilirubin, UA Negative Negative   Urobilinogen, Ur 0.2 0.2 - 1.0 mg/dL   Nitrite, UA Negative Negative   Microscopic Examination See below:   Rapid Strep Screen ([facility] ONLY)   Collection Time: 10/19/23  4:03 PM   Specimen: Other   Other  Release to pati  Result Value Ref Range   Strep Gp A Ag, IA W/Reflex Negative Negative  Culture, Group A Strep  Collection Time: 10/19/23  4:03 PM   Other  Release to pati  Result Value Ref Range   Strep A Culture Negative   Veritor Flu A/B Waived   Collection Time: 10/19/23  4:03 PM  Result Value Ref Range   Influenza A Negative Negative   Influenza B Negative Negative  Respiratory Panel w/ SARS-CoV2   Collection Time: 10/19/23  4:05 PM  Result Value Ref Range   Adenovirus CANCELED    Coronavirus HKU1 CANCELED    Coronavirus NL63 CANCELED    Coronavirus 229E CANCELED    Coronavirus OC43 CANCELED    SARS-CoV-2 CANCELED    Human Metapneumovirus CANCELED    Human Rhinovirus/Enterovirus CANCELED    Influenza A CANCELED    Influenza A/H1 CANCELED    Influenza A/H1-2009 CANCELED    Influenza A/H3 CANCELED    Influenza B CANCELED    Parainfluenza 1 CANCELED    Parainfluenza 2 CANCELED     Parainfluenza 3 CANCELED    Parainfluenza 4 CANCELED    Respiratory Syncytial Virus CANCELED    Bordetella parapertussis CANCELED    Bordetella pertussis CANCELED    Chlamydophila pneumoniae CANCELED    Mycoplasma pneumoniae CANCELED       Assessment & Plan:   Problem List Items Addressed This Visit   None Visit Diagnoses       Dysuria    -  Primary   Relevant Orders   Urinalysis, Routine w reflex microscopic        Follow up plan: No follow-ups on file.

## 2023-12-13 ENCOUNTER — Telehealth (INDEPENDENT_AMBULATORY_CARE_PROVIDER_SITE_OTHER): Admitting: Nurse Practitioner

## 2023-12-13 ENCOUNTER — Encounter: Payer: Self-pay | Admitting: Nurse Practitioner

## 2023-12-13 DIAGNOSIS — F5101 Primary insomnia: Secondary | ICD-10-CM

## 2023-12-13 MED ORDER — BELSOMRA 15 MG PO TABS: 15.0000 mg | ORAL_TABLET | Freq: Every evening | ORAL | 0 refills | Status: DC | PRN

## 2023-12-13 NOTE — Progress Notes (Signed)
 There were no vitals taken for this visit.   Subjective:    Patient ID: Virginia Austin, female    DOB: 28-May-1966, 58 y.o.   MRN: [id number]  HPI: Virginia Austin is a 58 y.o. female  Chief Complaint  Patient presents with   Insomnia   INSOMNIA Patient states she is having trouble sleeping.  She states this has been going on for several years off and on.  She has taken Belsorma before and used it as needed.  It helped her get back into a regular sleep pattern then she stopped taking the medication.      Relevant past medical, surgical, family and social history reviewed and updated as indicated. Interim medical history since our last visit reviewed. Allergies and medications reviewed and updated.  Review of Systems  Psychiatric/Behavioral:  Positive for sleep disturbance.     Per HPI unless specifically indicated above     Objective:    There were no vitals taken for this visit.  Wt Readings from Last 3 Encounters:  12/12/23 172 lb (78 kg)  10/19/23 178 lb 12.8 oz (81.1 kg)  09/14/23 178 lb 9.6 oz (81 kg)    Physical Exam Vitals and nursing note reviewed.  Constitutional:      General: She is not in acute distress.    Appearance: She is not ill-appearing.  HENT:     Head: Normocephalic.     Right Ear: Hearing normal.     Left Ear: Hearing normal.     Nose: Nose normal.  Pulmonary:     Effort: Pulmonary effort is normal. No respiratory distress.  Neurological:     Mental Status: She is alert.  Psychiatric:        Mood and Affect: Mood normal.        Behavior: Behavior normal.        Thought Content: Thought content normal.        Judgment: Judgment normal.     Results for orders placed or performed in visit on 12/12/23  Microscopic Examination   Collection Time: 12/12/23  4:15 PM   Urine  Result Value Ref Range   WBC, UA 0-5 0 - 5 /hpf   RBC, Urine None seen 0 - 2 /hpf   Epithelial Cells (non renal) 0-10 0 - 10 /hpf   Bacteria, UA None seen None seen/Few   Urinalysis, Routine w reflex microscopic   Collection Time: 12/12/23  4:15 PM  Result Value Ref Range   Specific Gravity, UA 1.010 1.005 - 1.030   pH, UA 6.0 5.0 - 7.5   Color, UA Yellow Yellow   Appearance Ur Clear Clear   Leukocytes,UA Trace (A) Negative   Protein,UA 1+ (A) Negative/Trace   Glucose, UA Negative Negative   Ketones, UA Negative Negative   RBC, UA Trace (A) Negative   Bilirubin, UA Negative Negative   Urobilinogen, Ur 0.2 0.2 - 1.0 mg/dL   Nitrite, UA Negative Negative   Microscopic Examination See below:       Assessment & Plan:   Problem List Items Addressed This Visit   None Visit Diagnoses       Primary insomnia    -  Primary   Has taken Belsroma in the past and done very well.  Aware of the risks of taking medication. Side effects and benefits discussed.  Follow up as scheduled.        Follow up plan: No follow-ups on file.   This visit was completed  via MyChart due to the restrictions of the COVID-19 pandemic. All issues as above were discussed and addressed. Physical exam was done as above through visual confirmation on MyChart. If it was felt that the patient should be evaluated in the office, they were directed there. The patient verbally consented to this visit. Location of the patient: Home Location of the provider: Office Those involved with this call:  Provider: Larae Grooms, NP CMA: Wilhemena Durie, CMA Front Desk/Registration: Servando Snare This encounter was conducted via video.  I spent 20 dedicated to the care of this patient on the date of this encounter to include previsit review of symptoms, plan of care, and follow up, face to face time with the patient, and post visit ordering of testing.

## 2023-12-13 NOTE — Assessment & Plan Note (Signed)
 Urinalysis completed in house, + leuk, + RBC- sending for culture. Pt being treated for suspected urinary tract infection.  Prescribed macrobid 100mg  BID for 5 days. Pt educated on need to contact clinic for worsening or unrelieved symptoms.

## 2023-12-14 ENCOUNTER — Encounter: Payer: Self-pay | Admitting: Nurse Practitioner

## 2023-12-14 LAB — URINE CULTURE: Organism ID, Bacteria: NO GROWTH

## 2023-12-18 ENCOUNTER — Telehealth: Payer: Self-pay

## 2023-12-18 NOTE — Telephone Encounter (Signed)
 PA for Belsomra initiated and submitted via Cover My Meds. Key: [id number]

## 2023-12-20 ENCOUNTER — Encounter: Payer: Self-pay | Admitting: Nurse Practitioner

## 2023-12-22 NOTE — Telephone Encounter (Signed)
 PA approved.

## 2023-12-26 ENCOUNTER — Other Ambulatory Visit: Payer: Self-pay | Admitting: Internal Medicine

## 2023-12-26 ENCOUNTER — Ambulatory Visit: Admitting: Internal Medicine

## 2023-12-26 ENCOUNTER — Ambulatory Visit: Payer: Self-pay

## 2023-12-26 ENCOUNTER — Encounter: Payer: Self-pay | Admitting: Internal Medicine

## 2023-12-26 VITALS — BP 106/70 | HR 79 | Ht 70.98 in | Wt 173.1 lb

## 2023-12-26 DIAGNOSIS — R300 Dysuria: Secondary | ICD-10-CM

## 2023-12-26 LAB — POCT URINALYSIS DIPSTICK
Bilirubin, UA: NEGATIVE
Glucose, UA: NEGATIVE
Ketones, UA: NEGATIVE
Nitrite, UA: NEGATIVE
Protein, UA: NEGATIVE
Spec Grav, UA: 1.015 (ref 1.010–1.025)
Urobilinogen, UA: 0.2 U/dL
pH, UA: 6.5 (ref 5.0–8.0)

## 2023-12-26 MED ORDER — SULFAMETHOXAZOLE-TRIMETHOPRIM 800-160 MG PO TABS
1.0000 | ORAL_TABLET | Freq: Two times a day (BID) | ORAL | 0 refills | Status: AC
Start: 2023-12-26 — End: 2024-01-02

## 2023-12-26 NOTE — Progress Notes (Signed)
 Date:  12/26/2023   Name:  Virginia Austin   DOB:  May 31, 1966   MRN:  [id number]   Chief Complaint: Dysuria  Dysuria  This is a recurrent problem. The current episode started in the past 7 days. The problem has been unchanged. The patient is experiencing no pain. There has been no fever. Associated symptoms include frequency and urgency. Pertinent negatives include no chills, discharge, flank pain, nausea or vomiting. She has tried antibiotics (completed macrobid 1 week ago but symptoms never completely resolved) for the symptoms. The treatment provided mild relief.  She had sx of UTI in February but culture was negative,  treated with Doxy.  She had recurrence of symptoms 2 weeks ago - treated with Macrobid but culture was again negative.  She felt that the symptoms never completely resolved and is now having more pelvic pressure. She has not noted hematuria.  She has no previous urologic hx. I reviewed a CT abdomen from last August - this showed a renal cyst but no mention of renal stones.  Review of Systems  Constitutional:  Negative for chills, fatigue and fever.  Respiratory:  Negative for chest tightness and shortness of breath.   Gastrointestinal:  Negative for abdominal pain, nausea and vomiting.  Genitourinary:  Positive for frequency and urgency. Negative for dysuria and flank pain.       Pelvic pressure  Musculoskeletal:  Negative for arthralgias.  Psychiatric/Behavioral:  Negative for dysphoric mood and sleep disturbance. The patient is not nervous/anxious.      Lab Results  Component Value Date   NA 137 09/14/2023   K 4.4 09/14/2023   CO2 22 09/14/2023   GLUCOSE 89 09/14/2023   BUN 9 09/14/2023   CREATININE 0.78 09/14/2023   CALCIUM 9.6 09/14/2023   EGFR 88 09/14/2023   GFRNONAA >60 05/09/2023   Lab Results  Component Value Date   CHOL 302 (H) 09/14/2023   HDL 49 09/14/2023   LDLCALC 205 (H) 09/14/2023   TRIG 246 (H) 09/14/2023   CHOLHDL 6.2 (H) 09/14/2023   Lab  Results  Component Value Date   TSH 1.490 09/14/2023   Lab Results  Component Value Date   HGBA1C 6.3 (H) 09/14/2023   Lab Results  Component Value Date   WBC 6.2 09/14/2023   HGB 13.3 09/14/2023   HCT 40.1 09/14/2023   MCV 92 09/14/2023   PLT 347 09/14/2023   Lab Results  Component Value Date   ALT 28 09/14/2023   AST 25 09/14/2023   ALKPHOS 123 (H) 09/14/2023   BILITOT 0.3 09/14/2023   Lab Results  Component Value Date   VD25OH 35.2 02/10/2023     Patient Active Problem List   Diagnosis Date Noted   Acute cystitis without hematuria 05/04/2022   Laceration of lesser toe of right foot without foreign body present or damage to nail 06/17/2021   Follow-up exam 06/17/2021   Visit for suture removal 05/03/2021   Laceration of toe without foreign body present or damage to nail 04/08/2021   Rash 12/07/2020   Left hand pain 10/23/2020   Hyperlipidemia    Polyp of sigmoid colon    Anxiety 06/15/2017   Depression 10/04/2016    Allergies  Allergen Reactions   Amoxicillin Other (See Comments)    Yeast infection  Yeast infection Yeast infection    Yeast infection    Past Surgical History:  Procedure Laterality Date   COLONOSCOPY WITH PROPOFOL N/A 06/06/2019   Procedure: COLONOSCOPY WITH BIOPSY;  Surgeon: Marnee Sink, MD;  Location: [facility];  Service: Endoscopy;  Laterality: N/A;   DILATATION & CURETTAGE/HYSTEROSCOPY WITH MYOSURE N/A 11/19/2015   Procedure: DILATATION & CURETTAGE/HYSTEROSCOPY WITH MYOSURE;  Surgeon: Meriam Stamp, MD;  Location: [facility];  Service: Gynecology;  Laterality: N/A;   HERNIA REPAIR     POLYPECTOMY N/A 06/06/2019   Procedure: POLYPECTOMY;  Surgeon: Marnee Sink, MD;  Location: [facility];  Service: Endoscopy;  Laterality: N/A;   SKIN CANCER EXCISION     TONSILLECTOMY      Social History   Tobacco Use   Smoking status: Never   Smokeless tobacco: Never  Vaping Use   Vaping status: Never Used  Substance Use Topics    Alcohol use: Yes    Alcohol/week: 1.0 standard drink of alcohol    Types: 1 Cans of beer per week    Comment: occasional beer   Drug use: No     Medication list has been reviewed and updated.  Current Meds  Medication Sig   citalopram (CELEXA) 40 MG tablet Take 1 tablet (40 mg total) by mouth daily.   Multiple Vitamin (MULTIVITAMIN WITH MINERALS) TABS tablet Take 1 tablet by mouth daily.   sulfamethoxazole-trimethoprim (BACTRIM DS) 800-160 MG tablet Take 1 tablet by mouth 2 (two) times daily for 7 days.       12/12/2023    4:11 PM 09/14/2023    1:22 PM 05/12/2023    1:42 PM 02/10/2023    8:31 AM  GAD 7 : Generalized Anxiety Score  Nervous, Anxious, on Edge -- 1 0 0  Control/stop worrying  1 0 0  Worry too much - different things  1 0 0  Trouble relaxing  1 0 0  Restless  1 0 0  Easily annoyed or irritable  1 0 0  Afraid - awful might happen  1 0 0  Total GAD 7 Score  7 0 0  Anxiety Difficulty   Not difficult at all Not difficult at all       09/14/2023    1:22 PM 05/12/2023    1:42 PM 02/10/2023    8:31 AM  Depression screen PHQ 2/9  Decreased Interest 0 0 0  Down, Depressed, Hopeless 0 0 0  PHQ - 2 Score 0 0 0  Altered sleeping 0 0 0  Tired, decreased energy 0 0 0  Change in appetite 0 0 0  Feeling bad or failure about yourself  0 0 0  Trouble concentrating 0 0 0  Moving slowly or fidgety/restless 0 0 0  Suicidal thoughts 0 0 0  PHQ-9 Score 0 0 0  Difficult doing work/chores  Not difficult at all Not difficult at all    BP Readings from Last 3 Encounters:  12/26/23 106/70  12/12/23 103/65  10/19/23 101/67    Physical Exam Vitals and nursing note reviewed.  Constitutional:      General: She is not in acute distress.    Appearance: Normal appearance. She is well-developed.  Cardiovascular:     Rate and Rhythm: Normal rate and regular rhythm.     Heart sounds: Normal heart sounds.  Pulmonary:     Effort: Pulmonary effort is normal. No respiratory distress.      Breath sounds: Normal breath sounds.  Abdominal:     General: Bowel sounds are normal.     Palpations: Abdomen is soft.     Tenderness: There is abdominal tenderness in the suprapubic area. There is no right CVA tenderness,  left CVA tenderness, guarding or rebound.  Skin:    General: Skin is warm and dry.  Neurological:     General: No focal deficit present.     Mental Status: She is alert.    Lab Results  Component Value Date   COLORU AMBER 12/26/2023   CLARITYU CLOUDY 12/26/2023   GLUCOSEUR Negative 12/26/2023   BILIRUBINUR NEGATIVE 12/26/2023   KETONESU NEGATIVE 12/26/2023   SPECGRAV 1.015 12/26/2023   RBCUR MODERATE 12/26/2023   PHUR 6.5 12/26/2023   PROTEINUR Negative 12/26/2023   UROBILINOGEN 0.2 12/26/2023   LEUKOCYTESUR Large (3+) (A) 12/26/2023     Wt Readings from Last 3 Encounters:  12/26/23 173 lb 2 oz (78.5 kg)  12/12/23 172 lb (78 kg)  10/19/23 178 lb 12.8 oz (81.1 kg)    BP 106/70   Pulse 79   Ht 5' 10.98" (1.803 m)   Wt 173 lb 2 oz (78.5 kg)   SpO2 97%   BMI 24.16 kg/m   Assessment and Plan:  Problem List Items Addressed This Visit   None Visit Diagnoses       Dysuria    -  Primary   recurrent sx with neg cultures; UA today with large leuks/blood will reculture; start Bactrim recommend Urology consultation - pt to message her PCP to order   Relevant Medications   sulfamethoxazole-trimethoprim (BACTRIM DS) 800-160 MG tablet   Other Relevant Orders   POCT urinalysis dipstick (Completed)       No follow-ups on file.    Sheron Dixons, MD [facility] [place]

## 2023-12-26 NOTE — Telephone Encounter (Signed)
 Chief Complaint: UTI Symptoms: Burning with urination, constant urge to urinate Frequency: Returned Thursday Pertinent Negatives: Patient denies fever, back pain, blood in urine Disposition: [] ED /[] Urgent Care (no appt availability in office) / [x] Appointment(In office/virtual)/ []  [facility]/ [] Home Care/ [] Refused Recommended Disposition /[] [facility]/ []  Follow-up with PCP Additional Notes: Patient called in, as encouraged by PCP via messaging, to schedule an appt for UTI evaluation for today. Patient states she has had ongoing symptoms of a UTI and was treated with antibiotics that caused the symptoms to diminish. However; they have since returned since Thursday. Patient states she is having 7/10 burning with urination and constant urge to urinate. Patient appt made for today for evaluation.   Copied from CRM [id number]. Topic: Clinical - Red Word Triage >> Dec 26, 2023  8:54 AM Lizabeth Riggs wrote: Red Word that prompted transfer to Nurse Triage:  Her UTI has gotten worse; has pain, feels like she needs to go to bathroom and can't Reason for Disposition  Urinating more frequently than usual (i.e., frequency)  Answer Assessment - Initial Assessment Questions 1. SYMPTOM: "What's the main symptom you're concerned about?" (e.g., frequency, incontinence)     Pain with urination, increased urgency, constant urge to go,  2. ONSET: "When did the  urinary symptoms  start?"     Was treated with abx but symptoms started back up Thursday of last week 3. PAIN: "Is there any pain?" If Yes, ask: "How bad is it?" (Scale: 1-10; mild, moderate, severe)     7 4. CAUSE: "What do you think is causing the symptoms?"     UTI 5. OTHER SYMPTOMS: "Do you have any other symptoms?" (e.g., blood in urine, fever, flank pain, pain with urination)     See above  Protocols used: Urinary Symptoms-A-AH

## 2023-12-26 NOTE — Addendum Note (Signed)
 Addended by: Alfonza Angry on: 12/26/2023 01:24 PM   Modules accepted: Orders

## 2023-12-30 LAB — URINE CULTURE

## 2023-12-31 ENCOUNTER — Encounter: Payer: Self-pay | Admitting: Internal Medicine

## 2024-01-15 LAB — HM MAMMOGRAPHY

## 2024-02-20 LAB — COLOGUARD: COLOGUARD: NEGATIVE

## 2024-03-19 ENCOUNTER — Ambulatory Visit: Payer: Self-pay | Admitting: Nurse Practitioner

## 2024-04-01 ENCOUNTER — Other Ambulatory Visit: Payer: Self-pay | Admitting: Nurse Practitioner

## 2024-04-02 ENCOUNTER — Other Ambulatory Visit: Payer: Self-pay | Admitting: Nurse Practitioner

## 2024-04-02 NOTE — Telephone Encounter (Signed)
 Copied from CRM [id number]. Topic: Clinical - Medication Refill >> Apr 02, 2024  9:33 AM Nathanel BROCKS wrote: Medication: citalopram  (CELEXA ) 40 MG tablet  Has the patient contacted their pharmacy? Yes  This is the patient's preferred pharmacy:  [facility] - [place], [place] - [address] [place] [address] Phone: [phone] Fax: [phone]  Is this the correct pharmacy for this prescription? Yes If no, delete pharmacy and type the correct one.   Has the prescription been filled recently? Yes  Is the patient out of the medication? Yes  Has the patient been seen for an appointment in the last year OR does the patient have an upcoming appointment? No  Can we respond through MyChart? Yes  Agent: Please be advised that Rx refills may take up to 3 business days. We ask that you follow-up with your pharmacy.

## 2024-04-03 ENCOUNTER — Encounter: Payer: Self-pay | Admitting: Nurse Practitioner

## 2024-04-03 NOTE — Telephone Encounter (Signed)
 Requested Prescriptions  Pending Prescriptions Disp Refills   citalopram  (CELEXA ) 40 MG tablet [Pharmacy Med Name: CITALOPRAM  HBR 40 MG TABLET] 90 tablet 0    Sig: Take 1 tablet (40 mg total) by mouth daily.     Psychiatry:  Antidepressants - SSRI Passed - 04/03/2024 11:28 AM      Passed - Completed PHQ-2 or PHQ-9 in the last 360 days      Passed - Valid encounter within last 6 months    Recent Outpatient Visits           3 months ago Dysuria   [place] [facility], Leita DEL, MD   3 months ago Primary insomnia   [facility] Melvin Pao, NP   3 months ago Acute cystitis without hematuria   [facility] [facility] Melvin Pao, NP   5 months ago Upper respiratory tract infection, unspecified type   [place] [facility] Herold Hadassah SQUIBB, MD

## 2024-04-04 MED ORDER — CITALOPRAM HYDROBROMIDE 40 MG PO TABS: 40.0000 mg | ORAL_TABLET | Freq: Every day | ORAL | 0 refills | Status: DC

## 2024-08-27 ENCOUNTER — Other Ambulatory Visit: Payer: Self-pay | Admitting: Nurse Practitioner

## 2024-08-28 ENCOUNTER — Telehealth: Payer: Self-pay | Admitting: Nurse Practitioner

## 2024-08-28 ENCOUNTER — Other Ambulatory Visit: Payer: Self-pay | Admitting: Nurse Practitioner

## 2024-08-28 NOTE — Telephone Encounter (Signed)
 Copied from CRM #[id number]. Topic: Clinical - Medication Question >> Aug 28, 2024  3:26 PM Tonda B wrote: Reason for CRM: pt has question about zolof please call pt [phone]

## 2024-08-29 ENCOUNTER — Other Ambulatory Visit: Payer: Self-pay | Admitting: Nurse Practitioner

## 2024-08-29 NOTE — Telephone Encounter (Signed)
 Called and left vm for patient to return our call.   Okay for E2C2 to speak to patient and find out what her questions are about Zoloft . Please also schedule follow up appointment per provider.

## 2024-08-29 NOTE — Telephone Encounter (Unsigned)
 Copied from CRM [id number]. Topic: Clinical - Medication Refill >> Aug 29, 2024 10:58 AM Gustabo D wrote: Medication: citalopram  (CELEXA ) 40 MG tablet    Has the patient contacted their pharmacy? Yes (Agent: If no, request that the patient contact the pharmacy for the refill. If patient does not wish to contact the pharmacy document the reason why and proceed with request.) (Agent: If yes, when and what did the pharmacy advise?)  This is the patient's preferred pharmacy:  [facility] - [place], [place] - [address] [place] [address] Phone: [phone] Fax: [phone]    Is this the correct pharmacy for this prescription? Yes If no, delete pharmacy and type the correct one.   Has the prescription been filled recently? Yes  Is the patient out of the medication? Yes  Has the patient been seen for an appointment in the last year OR does the patient have an upcoming appointment? Yes  Can we respond through MyChart? Yes  Agent: Please be advised that Rx refills may take up to 3 business days. We ask that you follow-up with your pharmacy.

## 2024-08-29 NOTE — Telephone Encounter (Addendum)
 Please call patient and find out what her question is? Patient is very over due for appt. Please schedule her follow up as well.

## 2024-08-30 ENCOUNTER — Other Ambulatory Visit: Payer: Self-pay

## 2024-08-30 MED ORDER — CITALOPRAM HYDROBROMIDE 40 MG PO TABS: 40.0000 mg | ORAL_TABLET | Freq: Every day | ORAL | 0 refills | Status: DC

## 2024-08-30 NOTE — Telephone Encounter (Signed)
 Called and spoke with patient. She is scheduled for a Follow-up appointment on 09/16/2024. Patient requested to have medications refilled to get her through til her next appointment.

## 2024-08-30 NOTE — Telephone Encounter (Signed)
 Medication sent to the pharmacy.

## 2024-08-30 NOTE — Telephone Encounter (Signed)
 No longer listed on current medication list Requested Prescriptions  Pending Prescriptions Disp Refills   escitalopram  (LEXAPRO ) 20 MG tablet [Pharmacy Med Name: ESCITALOPRAM  20 MG TABLET] 90 tablet 0    Sig: Take 1 tablet (20 mg total) by mouth daily.     Psychiatry:  Antidepressants - SSRI Failed - 08/30/2024 12:46 PM      Failed - Valid encounter within last 6 months    Recent Outpatient Visits           8 months ago Dysuria   [facility] at [facility], Leita DEL, MD   8 months ago Primary insomnia   [facility] Melvin Pao, NP   8 months ago Acute cystitis without hematuria   [facility] Melvin Pao, NP   10 months ago Upper respiratory tract infection, unspecified type   [place] [facility] Herold Hadassah SQUIBB, MD              Passed - Completed PHQ-2 or PHQ-9 in the last 360 days

## 2024-08-31 NOTE — Telephone Encounter (Signed)
 Already refilled on 08/30/24 in a separate refill encounter.Will refuse this request due to this.  Requested Prescriptions  Pending Prescriptions Disp Refills   citalopram  (CELEXA ) 40 MG tablet [Pharmacy Med Name: CITALOPRAM  HBR 40 MG TABLET] 30 tablet 0    Sig: Take 1 tablet (40 mg total) by mouth daily.     Psychiatry:  Antidepressants - SSRI Failed - 08/31/2024  7:15 AM      Failed - Valid encounter within last 6 months    Recent Outpatient Visits           8 months ago Dysuria   [facility] at [facility], Leita DEL, MD   8 months ago Primary insomnia   [facility] Melvin Pao, NP   8 months ago Acute cystitis without hematuria   [facility] Melvin Pao, NP   10 months ago Upper respiratory tract infection, unspecified type    [place] [facility] Herold Hadassah SQUIBB, MD              Passed - Completed PHQ-2 or PHQ-9 in the last 360 days

## 2024-09-02 NOTE — Telephone Encounter (Signed)
 Refilled 08/30/24 # 30. Requested Prescriptions  Refused Prescriptions Disp Refills   citalopram  (CELEXA ) 40 MG tablet 30 tablet 0    Sig: Take 1 tablet (40 mg total) by mouth daily.     Psychiatry:  Antidepressants - SSRI Failed - 09/02/2024  2:53 PM      Failed - Valid encounter within last 6 months    Recent Outpatient Visits           8 months ago Dysuria   [facility] at [facility], Leita DEL, MD   8 months ago Primary insomnia   [facility] Melvin Pao, NP   8 months ago Acute cystitis without hematuria   [facility] Melvin Pao, NP   10 months ago Upper respiratory tract infection, unspecified type   [place] [facility] Herold Hadassah SQUIBB, MD              Passed - Completed PHQ-2 or PHQ-9 in the last 360 days

## 2024-09-16 ENCOUNTER — Encounter: Payer: Self-pay | Admitting: Nurse Practitioner

## 2024-09-16 ENCOUNTER — Ambulatory Visit: Admitting: Nurse Practitioner

## 2024-09-16 VITALS — BP 107/70 | HR 78 | Temp 98.1°F | Resp 15 | Ht 67.01 in | Wt 181.2 lb

## 2024-09-16 DIAGNOSIS — E782 Mixed hyperlipidemia: Secondary | ICD-10-CM

## 2024-09-16 DIAGNOSIS — F419 Anxiety disorder, unspecified: Secondary | ICD-10-CM | POA: Diagnosis not present

## 2024-09-16 DIAGNOSIS — Z0000 Encounter for general adult medical examination without abnormal findings: Secondary | ICD-10-CM | POA: Diagnosis not present

## 2024-09-16 DIAGNOSIS — F331 Major depressive disorder, recurrent, moderate: Secondary | ICD-10-CM

## 2024-09-16 MED ORDER — CITALOPRAM HYDROBROMIDE 40 MG PO TABS: 40.0000 mg | ORAL_TABLET | Freq: Every day | ORAL | 1 refills | Status: AC

## 2024-09-16 NOTE — Assessment & Plan Note (Signed)
 Chronic.  Controlled.  Continue with current medication regimen.  Labs ordered today.  Return to clinic in 6 months for reevaluation.  Call sooner if concerns arise.  ? ?

## 2024-09-16 NOTE — Assessment & Plan Note (Signed)
 Chronic.  Controlled.  Continue with current medication regimen of Citalopram  20mg  daily.  Refills sent today.  Labs ordered today.  Return to clinic in 6 months for reevaluation.  Call sooner if concerns arise.

## 2024-09-16 NOTE — Progress Notes (Signed)
 "  BP 107/70 (BP Location: Left Arm, Patient Position: Sitting, Cuff Size: Normal)   Pulse 78   Temp 98.1 F (36.7 C) (Oral)   Resp 15   Ht 5' 7.01 (1.702 m)   Wt 181 lb 3.2 oz (82.2 kg)   SpO2 98%   BMI 28.37 kg/m    Subjective:    Patient ID: Virginia Austin, female    DOB: Feb 05, 1966, 59 y.o.   MRN: [id number]  HPI: Virginia Austin is a 59 y.o. female presenting on 09/16/2024 for comprehensive medical examination. Current medical complaints include:none  She currently lives with: Menopausal Symptoms: no  HYPERLIPIDEMIA Hyperlipidemia status: excellent compliance Satisfied with current treatment?  no Side effects:  no Medication compliance: excellent compliance Past cholesterol meds: none Supplements: none Aspirin:  no The ASCVD Risk score (Arnett DK, et al., 2019) failed to calculate for the following reasons:   According to ACC/AHA guidelines, patients with an LDL-C level greater than 190 mg/dL (5.08 mmol/L) should be considered for high-intensity statin therapy. This patient's most recent LDL-C level is 205 mg/dL. Chest pain:  no Coronary artery disease:  no Family history CAD:  no Family history early CAD:  no   Denies HA, CP, SOB, dizziness, palpitations, visual changes, and lower extremity swelling.   MOOD Patient states she is doing well.  She feels like the 20mg  is working well for her.  Denies concerns regarding medication at visit today.  Due for refill.  Denies SI.   Depression Screen done today and results listed below:     09/16/2024    2:36 PM 09/14/2023    1:22 PM 05/12/2023    1:42 PM 02/10/2023    8:31 AM 10/10/2022    8:48 AM  Depression screen PHQ 2/9  Decreased Interest 0 0 0 0 0  Down, Depressed, Hopeless 0 0 0 0 0  PHQ - 2 Score 0 0 0 0 0  Altered sleeping 0 0 0 0 0  Tired, decreased energy 0 0 0 0 0  Change in appetite 0 0 0 0 0  Feeling bad or failure about yourself  0 0 0 0 0  Trouble concentrating 0 0 0 0 0  Moving slowly or fidgety/restless 0 0  0 0 0  Suicidal thoughts 0 0 0 0 0  PHQ-9 Score 0 0  0  0  0   Difficult doing work/chores   Not difficult at all Not difficult at all Not difficult at all     Data saved with a previous flowsheet row definition    The patient  does a history of falls. I did complete a risk assessment for falls. A plan of care for falls was documented.   Past Medical History:  Past Medical History:  Diagnosis Date   Anxiety    Arthritis    knees   Chronic headaches    otc med prn   Depression    Dysthymic disorder    Hyperlipidemia    diet controlled, no meds   Impaired fasting glucose    Lumbago    Neck sprain and strain    Hx   Vitamin D  deficiency    Hx - resolved per patient   Wears contact lenses     Surgical History:  Past Surgical History:  Procedure Laterality Date   COLONOSCOPY WITH PROPOFOL  N/A 06/06/2019   Procedure: COLONOSCOPY WITH BIOPSY;  Surgeon: Jinny Carmine, MD;  Location: [facility];  Service: Endoscopy;  Laterality: N/A;  DILATATION & CURETTAGE/HYSTEROSCOPY WITH MYOSURE N/A 11/19/2015   Procedure: DILATATION & CURETTAGE/HYSTEROSCOPY WITH MYOSURE;  Surgeon: Charlie Flowers, MD;  Location: [facility];  Service: Gynecology;  Laterality: N/A;   HERNIA REPAIR     POLYPECTOMY N/A 06/06/2019   Procedure: POLYPECTOMY;  Surgeon: Jinny Carmine, MD;  Location: [facility];  Service: Endoscopy;  Laterality: N/A;   SKIN CANCER EXCISION     TONSILLECTOMY      Medications:  Current Outpatient Medications on File Prior to Visit  Medication Sig   Multiple Vitamin (MULTIVITAMIN WITH MINERALS) TABS tablet Take 1 tablet by mouth daily.   No current facility-administered medications on file prior to visit.    Allergies:  Allergies  Allergen Reactions   Amoxicillin  Other (See Comments)    Yeast infection  Yeast infection Yeast infection    Yeast infection    Social History:  Social History   Socioeconomic History   Marital status: Married    Spouse name: Not  on file   Number of children: Not on file   Years of education: Not on file   Highest education level: Not on file  Occupational History   Not on file  Tobacco Use   Smoking status: Never   Smokeless tobacco: Never  Vaping Use   Vaping status: Never Used  Substance and Sexual Activity   Alcohol use: Yes    Alcohol/week: 1.0 standard drink of alcohol    Types: 1 Cans of beer per week    Comment: occasional beer   Drug use: No   Sexual activity: Yes    Birth control/protection: Post-menopausal  Other Topics Concern   Not on file  Social History Narrative   Not on file   Social Drivers of Health   Tobacco Use: Low Risk (09/16/2024)   Patient History    Smoking Tobacco Use: Never    Smokeless Tobacco Use: Never    Passive Exposure: Not on file  Financial Resource Strain: Not on file  Food Insecurity: Not on file  Transportation Needs: Not on file  Physical Activity: Not on file  Stress: Not on file  Social Connections: Unknown (01/24/2022)   Received from [facility]   Social Network    Social Network: Not on file  Intimate Partner Violence: Unknown (12/16/2021)   Received from [facility]   HITS    Physically Hurt: Not on file    Insult or Talk Down To: Not on file    Threaten Physical Harm: Not on file    Scream or Curse: Not on file  Depression (PHQ2-9): Low Risk (09/16/2024)   Depression (PHQ2-9)    PHQ-2 Score: 0  Alcohol Screen: Not on file  Housing: Not on file  Utilities: Not on file  Health Literacy: Not on file   Social History   Tobacco Use  Smoking Status Never  Smokeless Tobacco Never   Social History   Substance and Sexual Activity  Alcohol Use Yes   Alcohol/week: 1.0 standard drink of alcohol   Types: 1 Cans of beer per week   Comment: occasional beer    Family History:  Family History  Problem Relation Age of Onset   Diabetes Father    Hypertension Father    Hypertension Sister    Diabetes Paternal Grandmother    Hypertension  Paternal Grandmother    Thyroid  disease Paternal Grandmother     Past medical history, surgical history, medications, allergies, family history and social history reviewed with patient today and changes made to  appropriate areas of the chart.   Review of Systems  Eyes:  Negative for blurred vision and double vision.  Respiratory:  Negative for shortness of breath.   Cardiovascular:  Negative for chest pain, palpitations and leg swelling.  Neurological:  Negative for dizziness and headaches.  Psychiatric/Behavioral:  Positive for depression. Negative for suicidal ideas. The patient is nervous/anxious.    All other ROS negative except what is listed above and in the HPI.      Objective:    BP 107/70 (BP Location: Left Arm, Patient Position: Sitting, Cuff Size: Normal)   Pulse 78   Temp 98.1 F (36.7 C) (Oral)   Resp 15   Ht 5' 7.01 (1.702 m)   Wt 181 lb 3.2 oz (82.2 kg)   SpO2 98%   BMI 28.37 kg/m   Wt Readings from Last 3 Encounters:  09/16/24 181 lb 3.2 oz (82.2 kg)  12/26/23 173 lb 2 oz (78.5 kg)  12/12/23 172 lb (78 kg)    Physical Exam Vitals and nursing note reviewed.  Constitutional:      General: She is awake. She is not in acute distress.    Appearance: Normal appearance. She is well-developed. She is not ill-appearing.  HENT:     Head: Normocephalic and atraumatic.     Right Ear: Hearing, tympanic membrane, ear canal and external ear normal. No drainage.     Left Ear: Hearing, tympanic membrane, ear canal and external ear normal. No drainage.     Nose: Nose normal.     Right Sinus: No maxillary sinus tenderness or frontal sinus tenderness.     Left Sinus: No maxillary sinus tenderness or frontal sinus tenderness.     Mouth/Throat:     Mouth: Mucous membranes are moist.     Pharynx: Oropharynx is clear. Uvula midline. No pharyngeal swelling, oropharyngeal exudate or posterior oropharyngeal erythema.  Eyes:     General: Lids are normal.        Right eye: No  discharge.        Left eye: No discharge.     Extraocular Movements: Extraocular movements intact.     Conjunctiva/sclera: Conjunctivae normal.     Pupils: Pupils are equal, round, and reactive to light.     Visual Fields: Right eye visual fields normal and left eye visual fields normal.  Neck:     Thyroid : No thyromegaly.     Vascular: No carotid bruit.     Trachea: Trachea normal.  Cardiovascular:     Rate and Rhythm: Normal rate and regular rhythm.     Heart sounds: Normal heart sounds. No murmur heard.    No gallop.  Pulmonary:     Effort: Pulmonary effort is normal. No accessory muscle usage or respiratory distress.     Breath sounds: Normal breath sounds.  Chest:  Breasts:    Right: Normal.     Left: Normal.  Abdominal:     General: Bowel sounds are normal.     Palpations: Abdomen is soft. There is no hepatomegaly or splenomegaly.     Tenderness: There is no abdominal tenderness.  Musculoskeletal:        General: Normal range of motion.     Cervical back: Normal range of motion and neck supple.     Right lower leg: No edema.     Left lower leg: No edema.  Lymphadenopathy:     Head:     Right side of head: No submental, submandibular, tonsillar, preauricular or posterior  auricular adenopathy.     Left side of head: No submental, submandibular, tonsillar, preauricular or posterior auricular adenopathy.     Cervical: No cervical adenopathy.     Upper Body:     Right upper body: No supraclavicular, axillary or pectoral adenopathy.     Left upper body: No supraclavicular, axillary or pectoral adenopathy.  Skin:    General: Skin is warm and dry.     Capillary Refill: Capillary refill takes less than 2 seconds.     Findings: No rash.  Neurological:     Mental Status: She is alert and oriented to person, place, and time.     Gait: Gait is intact.  Psychiatric:        Attention and Perception: Attention normal.        Mood and Affect: Mood normal.        Speech: Speech  normal.        Behavior: Behavior normal. Behavior is cooperative.        Thought Content: Thought content normal.        Judgment: Judgment normal.     Results for orders placed or performed in visit on 01/16/24  [facility] MAMMOGRAPHY   Collection Time: 01/15/24  3:39 PM  Result Value Ref Range   HM Mammogram 0-4 Bi-Rad 0-4 Bi-Rad, Self Reported Normal      Assessment & Plan:   Problem List Items Addressed This Visit       Other   Depression   Chronic.  Controlled.  Continue with current medication regimen of Citalopram  20mg  daily.  Refills sent today.  Labs ordered today.  Return to clinic in 6 months for reevaluation.  Call sooner if concerns arise.        Relevant Medications   citalopram  (CELEXA ) 40 MG tablet   Anxiety   Chronic.  Controlled.  Continue with current medication regimen of Citalopram  20mg  daily.  Refills sent today.  Labs ordered today.  Return to clinic in 6 months for reevaluation.  Call sooner if concerns arise.       Relevant Medications   citalopram  (CELEXA ) 40 MG tablet   Hyperlipidemia   Chronic.  Controlled.  Continue with current medication regimen.  Labs ordered today.  Return to clinic in 6 months for reevaluation.  Call sooner if concerns arise.        Relevant Orders   Lipid panel   Other Visit Diagnoses       Annual physical exam    -  Primary   Health maintenance reviewed during visit today.  Labs ordered.  Vaccines reviewed.  PAP, Mammogram and Colonoscopy are up to date.   Relevant Orders   CBC with Differential/Platelet   Comprehensive metabolic panel with GFR   Lipid panel   TSH         Follow up plan: Return in about 6 months (around 03/16/2025) for Depression/Anxiety FU.   LABORATORY TESTING:  - Pap smear: up to date  IMMUNIZATIONS:   - Tdap: Tetanus vaccination status reviewed: last tetanus booster within 10 years. - Influenza: Refused - Pneumovax: Refused - Prevnar: Refused - COVID: refused - HPV: Not applicable -  Shingrix vaccine: Refused  SCREENING: -Mammogram: Up to date  - Colonoscopy: Up to date  - Bone Density: Not applicable  -Hearing Test: Not applicable  -Spirometry: Not applicable   PATIENT COUNSELING:   Advised to take 1 mg of folate supplement per day if capable of pregnancy.   Sexuality: Discussed sexually transmitted diseases,  partner selection, use of condoms, avoidance of unintended pregnancy  and contraceptive alternatives.   Advised to avoid cigarette smoking.  I discussed with the patient that most people either abstain from alcohol or drink within safe limits (<=14/week and <=4 drinks/occasion for males, <=7/weeks and <= 3 drinks/occasion for females) and that the risk for alcohol disorders and other health effects rises proportionally with the number of drinks per week and how often a drinker exceeds daily limits.  Discussed cessation/primary prevention of drug use and availability of treatment for abuse.   Diet: Encouraged to adjust caloric intake to maintain  or achieve ideal body weight, to reduce intake of dietary saturated fat and total fat, to limit sodium intake by avoiding high sodium foods and not adding table salt, and to maintain adequate dietary potassium and calcium  preferably from fresh fruits, vegetables, and low-fat dairy products.    stressed the importance of regular exercise  Injury prevention: Discussed safety belts, safety helmets, smoke detector, smoking near bedding or upholstery.   Dental health: Discussed importance of regular tooth brushing, flossing, and dental visits.    NEXT PREVENTATIVE PHYSICAL DUE IN 1 YEAR. Return in about 6 months (around 03/16/2025) for Depression/Anxiety FU.          "

## 2024-09-17 ENCOUNTER — Ambulatory Visit: Payer: Self-pay | Admitting: Nurse Practitioner

## 2024-09-17 LAB — CBC WITH DIFFERENTIAL/PLATELET
Basophils Absolute: 0.1 x10E3/uL (ref 0.0–0.2)
Basos: 1 %
EOS (ABSOLUTE): 0.3 x10E3/uL (ref 0.0–0.4)
Eos: 3 %
Hematocrit: 40.9 % (ref 34.0–46.6)
Hemoglobin: 13.5 g/dL (ref 11.1–15.9)
Immature Grans (Abs): 0.1 x10E3/uL (ref 0.0–0.1)
Immature Granulocytes: 1 %
Lymphocytes Absolute: 4 x10E3/uL — ABNORMAL HIGH (ref 0.7–3.1)
Lymphs: 46 %
MCH: 30.7 pg (ref 26.6–33.0)
MCHC: 33 g/dL (ref 31.5–35.7)
MCV: 93 fL (ref 79–97)
Monocytes Absolute: 0.8 x10E3/uL (ref 0.1–0.9)
Monocytes: 9 %
Neutrophils Absolute: 3.4 x10E3/uL (ref 1.4–7.0)
Neutrophils: 40 %
Platelets: 421 x10E3/uL (ref 150–450)
RBC: 4.4 x10E6/uL (ref 3.77–5.28)
RDW: 13.2 % (ref 11.7–15.4)
WBC: 8.6 x10E3/uL (ref 3.4–10.8)

## 2024-09-17 LAB — LIPID PANEL
Chol/HDL Ratio: 5.2 ratio — ABNORMAL HIGH (ref 0.0–4.4)
Cholesterol, Total: 229 mg/dL — ABNORMAL HIGH (ref 100–199)
HDL: 44 mg/dL
LDL Chol Calc (NIH): 104 mg/dL — ABNORMAL HIGH (ref 0–99)
Triglycerides: 480 mg/dL — ABNORMAL HIGH (ref 0–149)
VLDL Cholesterol Cal: 81 mg/dL — ABNORMAL HIGH (ref 5–40)

## 2024-09-17 LAB — COMPREHENSIVE METABOLIC PANEL WITH GFR
ALT: 26 IU/L (ref 0–32)
AST: 20 IU/L (ref 0–40)
Albumin: 4.7 g/dL (ref 3.8–4.9)
Alkaline Phosphatase: 126 IU/L (ref 49–135)
BUN/Creatinine Ratio: 24 — ABNORMAL HIGH (ref 9–23)
BUN: 21 mg/dL (ref 6–24)
Bilirubin Total: 0.2 mg/dL (ref 0.0–1.2)
CO2: 21 mmol/L (ref 20–29)
Calcium: 9.9 mg/dL (ref 8.7–10.2)
Chloride: 98 mmol/L (ref 96–106)
Creatinine, Ser: 0.87 mg/dL (ref 0.57–1.00)
Globulin, Total: 2.7 g/dL (ref 1.5–4.5)
Glucose: 95 mg/dL (ref 70–99)
Potassium: 4.4 mmol/L (ref 3.5–5.2)
Sodium: 137 mmol/L (ref 134–144)
Total Protein: 7.4 g/dL (ref 6.0–8.5)
eGFR: 77 mL/min/1.73

## 2024-09-17 LAB — TSH: TSH: 1 u[IU]/mL (ref 0.450–4.500)

## 2024-09-17 MED ORDER — ROSUVASTATIN CALCIUM 5 MG PO TABS: 5.0000 mg | ORAL_TABLET | Freq: Every day | ORAL | 1 refills | Status: AC

## 2025-03-24 ENCOUNTER — Ambulatory Visit: Admitting: Nurse Practitioner
